# Patient Record
Sex: Male | Born: 1965 | Hispanic: No | Marital: Single | Smoking: Never smoker
Health system: Southern US, Community
[De-identification: ages and names within clinical notes are randomized; demographics above are authoritative.]

## PROBLEM LIST (undated history)

## (undated) DIAGNOSIS — E785 Hyperlipidemia, unspecified: Secondary | ICD-10-CM

## (undated) DIAGNOSIS — S2249XA Multiple fractures of ribs, unspecified side, initial encounter for closed fracture: Secondary | ICD-10-CM

## (undated) DIAGNOSIS — S62109A Fracture of unspecified carpal bone, unspecified wrist, initial encounter for closed fracture: Secondary | ICD-10-CM

## (undated) DIAGNOSIS — J302 Other seasonal allergic rhinitis: Secondary | ICD-10-CM

## (undated) DIAGNOSIS — S42009A Fracture of unspecified part of unspecified clavicle, initial encounter for closed fracture: Secondary | ICD-10-CM

## (undated) DIAGNOSIS — I10 Essential (primary) hypertension: Secondary | ICD-10-CM

## (undated) DIAGNOSIS — T7840XA Allergy, unspecified, initial encounter: Secondary | ICD-10-CM

## (undated) DIAGNOSIS — IMO0002 Reserved for concepts with insufficient information to code with codable children: Secondary | ICD-10-CM

## (undated) DIAGNOSIS — N200 Calculus of kidney: Secondary | ICD-10-CM

## (undated) HISTORY — DX: Fracture of unspecified carpal bone, unspecified wrist, initial encounter for closed fracture: S62.109A

## (undated) HISTORY — PX: POLYPECTOMY: SHX149

## (undated) HISTORY — DX: Calculus of kidney: N20.0

## (undated) HISTORY — DX: Essential (primary) hypertension: I10

## (undated) HISTORY — DX: Fracture of unspecified part of unspecified clavicle, initial encounter for closed fracture: S42.009A

## (undated) HISTORY — DX: Other seasonal allergic rhinitis: J30.2

## (undated) HISTORY — PX: CLOSED REDUCTION ZYGOMATIC ARCH FRACTURE: SHX1362

## (undated) HISTORY — DX: Multiple fractures of ribs, unspecified side, initial encounter for closed fracture: S22.49XA

## (undated) HISTORY — DX: Hyperlipidemia, unspecified: E78.5

## (undated) HISTORY — DX: Allergy, unspecified, initial encounter: T78.40XA

## (undated) HISTORY — PX: OTHER SURGICAL HISTORY: SHX169

## (undated) HISTORY — DX: Reserved for concepts with insufficient information to code with codable children: IMO0002

---

## 2009-08-07 DIAGNOSIS — N200 Calculus of kidney: Secondary | ICD-10-CM

## 2009-08-07 HISTORY — DX: Calculus of kidney: N20.0

## 2010-01-27 ENCOUNTER — Encounter: Admission: RE | Admit: 2010-01-27 | Discharge: 2010-01-27 | Payer: Self-pay | Admitting: Orthopedic Surgery

## 2010-02-18 ENCOUNTER — Ambulatory Visit (HOSPITAL_COMMUNITY): Admission: RE | Admit: 2010-02-18 | Discharge: 2010-02-18 | Payer: Self-pay | Admitting: Orthopedic Surgery

## 2010-07-17 ENCOUNTER — Emergency Department (HOSPITAL_COMMUNITY)
Admission: EM | Admit: 2010-07-17 | Discharge: 2010-07-18 | Payer: Self-pay | Source: Home / Self Care | Admitting: Emergency Medicine

## 2010-08-28 ENCOUNTER — Encounter: Payer: Self-pay | Admitting: Orthopedic Surgery

## 2010-10-18 LAB — CBC
MCH: 31 pg (ref 26.0–34.0)
Platelets: 218 10*3/uL (ref 150–400)
RBC: 4.74 MIL/uL (ref 4.22–5.81)
WBC: 10 10*3/uL (ref 4.0–10.5)

## 2010-10-18 LAB — POCT I-STAT, CHEM 8
Chloride: 106 mEq/L (ref 96–112)
HCT: 47 % (ref 39.0–52.0)
Potassium: 3.5 mEq/L (ref 3.5–5.1)
Sodium: 143 mEq/L (ref 135–145)

## 2010-10-18 LAB — URINE MICROSCOPIC-ADD ON

## 2010-10-18 LAB — COMPREHENSIVE METABOLIC PANEL
AST: 40 U/L — ABNORMAL HIGH (ref 0–37)
Albumin: 4.3 g/dL (ref 3.5–5.2)
Chloride: 106 mEq/L (ref 96–112)
Creatinine, Ser: 1.04 mg/dL (ref 0.4–1.5)
GFR calc Af Amer: >60 mL/min (ref >60)
Potassium: 3.5 mEq/L (ref 3.5–5.1)
Total Bilirubin: 0.7 mg/dL (ref 0.3–1.2)

## 2010-10-18 LAB — URINALYSIS, ROUTINE W REFLEX MICROSCOPIC
Bilirubin Urine: NEGATIVE
Glucose, UA: NEGATIVE mg/dL
Ketones, ur: NEGATIVE mg/dL
Nitrite: NEGATIVE
pH: 7 (ref 5.0–8.0)

## 2010-10-18 LAB — DIFFERENTIAL
Basophils Absolute: 0 10*3/uL (ref 0.0–0.1)
Eosinophils Relative: 1 % (ref 0–5)
Lymphocytes Relative: 41 % (ref 12–46)
Monocytes Absolute: 0.7 10*3/uL (ref 0.1–1.0)

## 2014-08-07 HISTORY — PX: COLONOSCOPY: SHX174

## 2014-10-23 ENCOUNTER — Encounter: Payer: Self-pay | Admitting: Internal Medicine

## 2014-11-25 ENCOUNTER — Encounter: Payer: Self-pay | Admitting: Internal Medicine

## 2014-12-02 ENCOUNTER — Ambulatory Visit (AMBULATORY_SURGERY_CENTER): Payer: Self-pay | Admitting: *Deleted

## 2014-12-02 VITALS — Ht 67.0 in | Wt 200.0 lb

## 2014-12-02 DIAGNOSIS — Z1211 Encounter for screening for malignant neoplasm of colon: Secondary | ICD-10-CM

## 2014-12-02 MED ORDER — NA SULFATE-K SULFATE-MG SULF 17.5-3.13-1.6 GM/177ML PO SOLN: 1.0000 | Freq: Once | ORAL | Status: DC

## 2014-12-02 MED ORDER — MOVIPREP 100 G PO SOLR: 1.0000 | Freq: Once | ORAL | Status: DC

## 2014-12-02 NOTE — Progress Notes (Signed)
Denies allergies to eggs or soy products. Denies complications with sedation or anesthesia. Denies O2 use. Denies use of diet or weight loss medications.  Emmi instructions given for colonoscopy.  

## 2014-12-11 ENCOUNTER — Other Ambulatory Visit: Payer: Self-pay | Admitting: Internal Medicine

## 2014-12-11 ENCOUNTER — Ambulatory Visit (AMBULATORY_SURGERY_CENTER): Payer: BLUE CROSS/BLUE SHIELD | Admitting: Internal Medicine

## 2014-12-11 ENCOUNTER — Encounter: Payer: Self-pay | Admitting: Internal Medicine

## 2014-12-11 VITALS — BP 125/88 | HR 74 | Temp 97.1°F | Resp 14 | Ht 67.0 in | Wt 200.0 lb

## 2014-12-11 DIAGNOSIS — D120 Benign neoplasm of cecum: Secondary | ICD-10-CM | POA: Diagnosis not present

## 2014-12-11 DIAGNOSIS — Z1211 Encounter for screening for malignant neoplasm of colon: Secondary | ICD-10-CM | POA: Diagnosis present

## 2014-12-11 MED ORDER — SODIUM CHLORIDE 0.9 % IV SOLN: 500.0000 mL | INTRAVENOUS | Status: DC

## 2014-12-11 NOTE — Progress Notes (Signed)
Called to room to assist during endoscopic procedure.  Patient ID and intended procedure confirmed with present staff. Received instructions for my participation in the procedure from the performing physician.  

## 2014-12-11 NOTE — Op Note (Signed)
[place]  [address] [place], [id number]   COLONOSCOPY PROCEDURE REPORT  PATIENT: Samuel, Stewart  MR#: [id number] BIRTHDATE: [birth year] , 48  yrs. old GENDER: male ENDOSCOPIST: Jerene Bears, MD REFERRED FO:YDXA Perini, M.D. PROCEDURE DATE:  12/11/2014 PROCEDURE:   Colonoscopy, screening and Colonoscopy with snare polypectomy First Screening Colonoscopy - Avg.  risk and is 50 yrs.  old or older Yes.  Prior Negative Screening - Now for repeat screening. N/A  History of Adenoma - Now for follow-up colonoscopy & has been > or = to 3 yrs.  N/A  Polyps Removed Today ASA CLASS:   Class II INDICATIONS:Screening for colonic neoplasia and Colorectal Neoplasm Risk Assessment for this procedure is average risk. MEDICATIONS: Monitored anesthesia care and Propofol 200 mg IV  DESCRIPTION OF PROCEDURE:   After the risks benefits and alternatives of the procedure were thoroughly explained, informed consent was obtained.  The digital rectal exam revealed no rectal mass.   The LB CF-[id number] Loaner [id number]  endoscope was introduced through the anus and advanced to the cecum, which was identified by both the appendix and ileocecal valve. No adverse events experienced.   The quality of the prep was good.  (MoviPrep was used)  The instrument was then slowly withdrawn as the colon was fully examined.  COLON FINDINGS: A sessile polyp measuring 5 mm in size was found at the cecum.  A polypectomy was performed with a cold snare.  The resection was complete, the polyp tissue was completely retrieved and sent to histology.   There was moderate diverticulosis noted in the descending colon and sigmoid colon.  Retroflexed views revealed internal hemorrhoids. The time to cecum = 1.5 Withdrawal time = 10.5   The scope was withdrawn and the procedure completed. COMPLICATIONS: There were no immediate complications.  ENDOSCOPIC IMPRESSION: 1.   Sessile polyp was found at the cecum;  polypectomy was performed with a cold snare 2.   Moderate diverticulosis was noted in the descending colon and sigmoid colon  RECOMMENDATIONS: 1.  Await pathology results 2.  High fiber diet 3.  If the polyp removed today is proven to be an adenomatous (pre-cancerous) polyp, you will need a repeat colonoscopy in 5 years.  Otherwise you should continue to follow colorectal cancer screening guidelines for "routine risk" patients with colonoscopy in 10 years.  You will receive a letter within 1-2 weeks with the results of your biopsy as well as final recommendations.  Please call my office if you have not received a letter after 3 weeks.  eSigned:  Jerene Bears, MD 12/11/2014 12:24 PM   cc: Crist Infante, MD and The Patient

## 2014-12-11 NOTE — Patient Instructions (Signed)
Discharge instructions given. Handouts on polyps,diverticulosis and hemorrhoids. Resume previous medications. YOU HAD AN ENDOSCOPIC PROCEDURE TODAY AT THE  ENDOSCOPY CENTER:   Refer to the procedure report that was given to you for any specific questions about what was found during the examination.  If the procedure report does not answer your questions, please call your gastroenterologist to clarify.  If you requested that your care partner not be given the details of your procedure findings, then the procedure report has been included in a sealed envelope for you to review at your convenience later.  YOU SHOULD EXPECT: Some feelings of bloating in the abdomen. Passage of more gas than usual.  Walking can help get rid of the air that was put into your GI tract during the procedure and reduce the bloating. If you had a lower endoscopy (such as a colonoscopy or flexible sigmoidoscopy) you may notice spotting of blood in your stool or on the toilet paper. If you underwent a bowel prep for your procedure, you may not have a normal bowel movement for a few days.  Please Note:  You might notice some irritation and congestion in your nose or some drainage.  This is from the oxygen used during your procedure.  There is no need for concern and it should clear up in a day or so.  SYMPTOMS TO REPORT IMMEDIATELY:   Following lower endoscopy (colonoscopy or flexible sigmoidoscopy):  Excessive amounts of blood in the stool  Significant tenderness or worsening of abdominal pains  Swelling of the abdomen that is new, acute  Fever of 100F or higher   For urgent or emergent issues, a gastroenterologist can be reached at any hour by calling [phone].   DIET: Your first meal following the procedure should be a small meal and then it is ok to progress to your normal diet. Heavy or fried foods are harder to digest and may make you feel nauseous or bloated.  Likewise, meals heavy in dairy and  vegetables can increase bloating.  Drink plenty of fluids but you should avoid alcoholic beverages for 24 hours.  ACTIVITY:  You should plan to take it easy for the rest of today and you should NOT DRIVE or use heavy machinery until tomorrow (because of the sedation medicines used during the test).    FOLLOW UP: Our staff will call the number listed on your records the next business day following your procedure to check on you and address any questions or concerns that you may have regarding the information given to you following your procedure. If we do not reach you, we will leave a message.  However, if you are feeling well and you are not experiencing any problems, there is no need to return our call.  We will assume that you have returned to your regular daily activities without incident.  If any biopsies were taken you will be contacted by phone or by letter within the next 1-3 weeks.  Please call us at [phone] if you have not heard about the biopsies in 3 weeks.    SIGNATURES/CONFIDENTIALITY: You and/or your care partner have signed paperwork which will be entered into your electronic medical record.  These signatures attest to the fact that that the information above on your After Visit Summary has been reviewed and is understood.  Full responsibility of the confidentiality of this discharge information lies with you and/or your care-partner. 

## 2014-12-11 NOTE — Progress Notes (Signed)
A/ox3 pleased with MAC, report to Celia RN 

## 2014-12-14 ENCOUNTER — Telehealth: Payer: Self-pay | Admitting: *Deleted

## 2014-12-14 NOTE — Telephone Encounter (Signed)
  Follow up Call-  Call back number 12/11/2014  Post procedure Call Back phone  # [phone]  Permission to leave phone message Yes     Patient questions:  Do you have a fever, pain , or abdominal swelling? No. Pain Score  0 *  Have you tolerated food without any problems? Yes.    Have you been able to return to your normal activities? Yes.    Do you have any questions about your discharge instructions: Diet   No. Medications  No. Follow up visit  No.  Do you have questions or concerns about your Care? Yes.    Actions: * If pain score is 4 or above: No action needed, pain <4. Patient stating he was sent two preps for the procedure. This has been turned over to Director of [place], patient to expect a call.

## 2014-12-15 ENCOUNTER — Encounter: Payer: Self-pay | Admitting: Internal Medicine

## 2014-12-18 ENCOUNTER — Encounter: Payer: Self-pay | Admitting: Internal Medicine

## 2014-12-28 ENCOUNTER — Encounter: Payer: Self-pay | Admitting: Internal Medicine

## 2015-07-07 ENCOUNTER — Other Ambulatory Visit: Payer: Self-pay | Admitting: Orthopaedic Surgery

## 2015-07-07 DIAGNOSIS — M25551 Pain in right hip: Secondary | ICD-10-CM

## 2015-07-23 ENCOUNTER — Other Ambulatory Visit: Payer: BLUE CROSS/BLUE SHIELD

## 2015-08-10 ENCOUNTER — Ambulatory Visit
Admission: RE | Admit: 2015-08-10 | Discharge: 2015-08-10 | Disposition: A | Discharge status: Home / Self Care | Payer: BLUE CROSS/BLUE SHIELD | Source: Ambulatory Visit | Attending: Orthopaedic Surgery | Admitting: Orthopaedic Surgery

## 2015-08-10 DIAGNOSIS — M25551 Pain in right hip: Secondary | ICD-10-CM

## 2015-09-10 ENCOUNTER — Encounter (HOSPITAL_COMMUNITY): Payer: BLUE CROSS/BLUE SHIELD

## 2015-09-15 ENCOUNTER — Telehealth: Payer: Self-pay | Admitting: Cardiology

## 2015-09-15 NOTE — Telephone Encounter (Signed)
Received records from [facility] for appointment on 09/23/15 with Dr Percival Spanish.  Records given to [facility] (medical records) for Dr Hochrein's schedule on 09/23/15. lp

## 2015-09-17 ENCOUNTER — Encounter: Payer: Self-pay | Admitting: Cardiovascular Disease

## 2015-09-17 ENCOUNTER — Ambulatory Visit (INDEPENDENT_AMBULATORY_CARE_PROVIDER_SITE_OTHER): Payer: BLUE CROSS/BLUE SHIELD | Admitting: Cardiovascular Disease

## 2015-09-17 VITALS — BP 140/92 | HR 80 | Ht 67.0 in | Wt 198.0 lb

## 2015-09-17 DIAGNOSIS — Z79899 Other long term (current) drug therapy: Secondary | ICD-10-CM

## 2015-09-17 DIAGNOSIS — I10 Essential (primary) hypertension: Secondary | ICD-10-CM

## 2015-09-17 HISTORY — DX: Essential (primary) hypertension: I10

## 2015-09-17 MED ORDER — LOSARTAN POTASSIUM 25 MG PO TABS: 25.0000 mg | ORAL_TABLET | Freq: Every day | ORAL | Status: DC

## 2015-09-17 NOTE — Progress Notes (Signed)
Cardiology Office Note   Date:  09/17/2015   ID:  Samuel Stewart, DOB 19-Mar-1966, MRN [id number]  PCP:  Samuel Ly, MD  Cardiologist:   Samuel Harness, MD   Chief Complaint  Patient presents with  . Hypertension    no chest pain, no shortness of breath, no swelling, no cramping, no dizziness or lightheadedness      History of Present Illness: Samuel Stewart is a 50 y.o. male with hypertension and hyperlipidemia who presents for an evaluation of hypertension.  Samuel Stewart reports that his blood pressure has been intermittently elevated since at least 2013.  He has never been on any medication for this. His mother is a pediatrician in his father is a Financial trader.  They live in [place], however, whenever she visits she checks his blood pressure. She brings a record that shows that it has been as high as 160/104. He saw Dr. Joylene Draft on 09/10/15 and his blood pressure was elevated to 155/98.   Dr. Joylene Draft recommended that he start valsartan.  However, Samuel Stewart requested seeing a cardiologist before starting this medication.  Samuel Stewart does not get regular exercise.  He denies any chest pain, shortness of breath, palpitations, lightheadedness, dizziness, vision changes, lower extremity edema or orthopnea His only complaint is occasional pain in his hips that is attributable to transient osteoporosis and is improving after taking vitamin D.   Past Medical History  Diagnosis Date  . Seasonal allergies   . Kidney stones 2011  . Wrist fracture     bilateral  . Multiple rib fractures   . Compression fracture     2 thoracic, 1 lumbar  . Collar bone fracture   . Hyperlipidemia   . Essential hypertension 09/17/2015    Past Surgical History  Procedure Laterality Date  . Closed reduction zygomatic arch fracture    . Olecranon process fracture    . Osteochondritis desicant       Current Outpatient Prescriptions  Medication Sig Dispense Refill  . atorvastatin (LIPITOR)  80 MG tablet Take 80 mg by mouth daily.    Marland Kitchen ezetimibe (ZETIA) 10 MG tablet Take 20 mg by mouth daily.    Marland Kitchen losartan (COZAAR) 25 MG tablet Take 1 tablet (25 mg total) by mouth daily. 30 tablet 6   No current facility-administered medications for this visit.    Allergies:   Ibuprofen and Iodine    Social History:  The patient  reports that he has never smoked. He has never used smokeless tobacco. He reports that he does not use illicit drugs.   Family History:  The patient's family history includes Bladder Cancer in his maternal grandfather; Hypertension in his father and mother; Kidney disease in his maternal grandmother. There is no history of Colon cancer.    ROS:  Please see the history of present illness.   Otherwise, review of systems are positive for none.   All other systems are reviewed and negative.    PHYSICAL EXAM: VS:  BP 140/92 mmHg  Pulse 80  Ht 5\' 7"  (1.702 m)  Wt 89.812 kg (198 lb)  BMI 31.00 kg/m2 , BMI Body mass index is 31 kg/(m^2). GENERAL:  Well appearing HEENT:  Pupils equal round and reactive, fundi not visualized, oral mucosa unremarkable NECK:  No jugular venous distention, waveform within normal limits, carotid upstroke brisk and symmetric, no bruits, no thyromegaly LYMPHATICS:  No cervical adenopathy LUNGS:  Clear to auscultation bilaterally HEART:  RRR.  PMI not displaced or  sustained,S1 and S2 within normal limits, no S3, no S4, no clicks, no rubs, no murmurs ABD:  Flat, positive bowel sounds normal in frequency in pitch, no bruits, no rebound, no guarding, no midline pulsatile mass, no hepatomegaly, no splenomegaly EXT:  2 plus pulses throughout, no edema, no cyanosis no clubbing SKIN:  No rashes no nodules NEURO:  Cranial nerves II through XII grossly intact, motor grossly intact throughout PSYCH:  Cognitively intact, oriented to person place and time    EKG:  EKG is ordered today. The ekg ordered today demonstrates sinus rhythm.  Rate 80 bpm.     Recent Labs: No results found for requested labs within last 365 days.    09/02/15: Magnesium 2.4 Phosphorus 3.1 Sodium 140, potassium 4.2, BUN 16, creatinine 1 WBC 8,hemoglobin 14.8, hematocrit 42.6, platelets 246  Lipid Panel No results found for: CHOL, TRIG, HDL, CHOLHDL, VLDL, LDLCALC, LDLDIRECT    Wt Readings from Last 3 Encounters:  09/17/15 89.812 kg (198 lb)  08/10/15 88.451 kg (195 lb)  12/11/14 90.719 kg (200 lb)      ASSESSMENT AND PLAN:  # Hypertension: Mr. [name] blood pressure is elevated slightly today.They started on the readings in his ambulatory log as well as his blood pressure when he saw Dr. Joylene Draft, I agree that he should startan antihypertensive at this time. We will start losartan 25 mg daily with plans to repeat his basic metabolic panel in one week. He does not have any evidence of heart failure or LVH on his EKG. Therefore, I do not think he needs an echocardiogram or any additional testing at this time. I instructed him that if he were to start exercising and lose weight, he may not need any antihypertensives at all right now.   Current medicines are reviewed at length with the patient today.  The patient does not have concerns regarding medicines.  The following changes have been made:  Start losartan 25 mg daily  Labs/ tests ordered today include:   Orders Placed This Encounter  Procedures  . Basic metabolic panel  . EKG 12-Lead     Disposition:   FU with Samuel Stewart C. Oval Linsey, MD, [facility] in 1 month    This note was written with the assistance of speech recognition software.  Please excuse any transcriptional errors.  Signed, Samuel Stewart C. Oval Linsey, MD, [facility]  09/17/2015 2:01 PM    [facility]

## 2015-09-17 NOTE — Patient Instructions (Signed)
PLEASE HAVE LABS-BMP-- IN 7 DAYS AFTER STARTING LOSARTAN 25 MG. WILL CALL WITH RESULTS   START LOSARTAN 25 MG ONE TABLET DAILY.   Your physician wants you to follow-up in [place]. If you need a refill on your cardiac medications before your next appointment, please call your pharmacy.

## 2015-09-20 ENCOUNTER — Encounter: Payer: Self-pay | Admitting: *Deleted

## 2015-09-23 ENCOUNTER — Ambulatory Visit: Payer: BLUE CROSS/BLUE SHIELD | Admitting: Cardiology

## 2015-09-24 ENCOUNTER — Other Ambulatory Visit: Payer: Self-pay | Admitting: *Deleted

## 2015-09-24 DIAGNOSIS — M898X9 Other specified disorders of bone, unspecified site: Secondary | ICD-10-CM

## 2015-09-24 DIAGNOSIS — M8580 Other specified disorders of bone density and structure, unspecified site: Secondary | ICD-10-CM

## 2015-09-24 DIAGNOSIS — M810 Age-related osteoporosis without current pathological fracture: Secondary | ICD-10-CM

## 2015-09-25 LAB — BASIC METABOLIC PANEL
BUN: 12 mg/dL (ref 7–25)
CHLORIDE: 105 mmol/L (ref 98–110)
CO2: 27 mmol/L (ref 20–31)
Calcium: 9.5 mg/dL (ref 8.6–10.3)
Creat: 0.9 mg/dL (ref 0.70–1.33)
GLUCOSE: 94 mg/dL (ref 65–99)
POTASSIUM: 4.2 mmol/L (ref 3.5–5.3)
Sodium: 140 mmol/L (ref 135–146)

## 2015-10-05 ENCOUNTER — Telehealth: Payer: Self-pay | Admitting: Physician Assistant

## 2015-10-05 ENCOUNTER — Telehealth: Payer: Self-pay | Admitting: Cardiovascular Disease

## 2015-10-05 NOTE — Telephone Encounter (Signed)
Paged by answering service. Patient has a question regarding his blood pressure medicine  losartan.Dr. Blenda Mounts daw his vitamin D is overall doing this blood work which is managed by his PCP. Advised patient to call his PCP office if he can draw both blood work at same time. Seems ordered already been placed.  Advised patient to keep log his blood pressure range. F/u with Dr. Oval Linsey if blood presser is still elevated.  Erric Machnik, [place]

## 2015-10-05 NOTE — Telephone Encounter (Signed)
Samuel Stewart reports he has been very dissatisfied with his service and care. Was not given results of a Vitamin D level from [place] (and after investigation, appears it was not drawn). He is concerned because he is trying to follow an issue of transient osteoporosis. I apologized to patient and stated anything we could do to reasonably address his concerns, we would try to do. Pt does admit some of this is out of our hands, for example, he notes [facility] lab charged him a $30 hold on his card for labwork.  Asks if in retrospect reasonable to request PCP to manage his HTN issues because he has not gotten feedback about what to do regarding his losartan.  Notes he is currently taking 25mg  daily, initially BPs were averaging 0000000 systolic, now [id number] systolic. He is looking for further improvement.  He would like to see if Dr. Oval Linsey suggests increasing or changing med. Will route for advice.

## 2015-10-05 NOTE — Telephone Encounter (Signed)
Calling because he has been taken this medication (Losartan 25mg ) and his blood pressure has not change , Wants to know how long does the medication takes to affect . Trying to understand and is asking if someone can call him .   Thanks

## 2015-10-05 NOTE — Telephone Encounter (Signed)
LMTCB  There is an orders only encounter where a vitamin D 1-25 dihdroxy was ordered on 2/18 - no results in EPIC so unsure if this lab was processed

## 2015-10-05 NOTE — Telephone Encounter (Signed)
I'm not sure that I understand.  I did not order a Vitamin D level, as I do not manage osteoporosis.  I recommended that he continue his losartan based on the BMP that was drawn on 2/17.  This message was relayed on 2/18.  Based on these blood pressures I would recommend increasing his losartan to 50 mg and repeating the BMP in one week.  I'm happy to see him in clinic in 1-2 weeks to make additional adjustments to his medication as necessary.  If his blood pressure has decreased from 150-160 to 140-150 it is working, he likely just needs a stronger dose.

## 2015-10-13 ENCOUNTER — Telehealth: Payer: Self-pay | Admitting: Cardiovascular Disease

## 2015-10-13 DIAGNOSIS — Z79899 Other long term (current) drug therapy: Secondary | ICD-10-CM

## 2015-10-13 NOTE — Telephone Encounter (Signed)
PATIENT NEEDED ORDER PLACED  BMP -- ORDER GIVEN LAST WEEK  INCREASED LOSARTAN TO 50 MG DAILY [facility] REP IS AWARE.   PATIENT AWARE - VITAMIN D LEVEL RESULTS WILL BE SENT TO DR PERINI TO EVALUATE

## 2015-10-14 LAB — BASIC METABOLIC PANEL
BUN: 15 mg/dL (ref 7–25)
CALCIUM: 9.3 mg/dL (ref 8.6–10.3)
CHLORIDE: 103 mmol/L (ref 98–110)
CO2: 25 mmol/L (ref 20–31)
CREATININE: 0.91 mg/dL (ref 0.70–1.33)
Glucose, Bld: 97 mg/dL (ref 65–99)
Potassium: 4.8 mmol/L (ref 3.5–5.3)
Sodium: 142 mmol/L (ref 135–146)

## 2015-10-14 NOTE — Telephone Encounter (Signed)
lmtc 3/9

## 2015-10-15 ENCOUNTER — Encounter: Payer: Self-pay | Admitting: Cardiovascular Disease

## 2015-10-15 ENCOUNTER — Ambulatory Visit (INDEPENDENT_AMBULATORY_CARE_PROVIDER_SITE_OTHER): Payer: BLUE CROSS/BLUE SHIELD | Admitting: Cardiovascular Disease

## 2015-10-15 VITALS — BP 144/90 | HR 100 | Ht 67.0 in | Wt 196.2 lb

## 2015-10-15 DIAGNOSIS — E785 Hyperlipidemia, unspecified: Secondary | ICD-10-CM

## 2015-10-15 DIAGNOSIS — I10 Essential (primary) hypertension: Secondary | ICD-10-CM | POA: Diagnosis not present

## 2015-10-15 NOTE — Patient Instructions (Addendum)
Medication Instructions:  STOP LOSARTAN    Labwork: NONE  Testing/Procedures: NONE  Follow-Up: AS NEEDED   If you need a refill on your cardiac medications before your next appointment, please call your pharmacy.

## 2015-10-15 NOTE — Progress Notes (Signed)
Cardiology Office Note   Date:  10/15/2015   ID:  Samuel Stewart, DOB 09-11-1965, MRN [id number]  PCP:  Samuel Ly, MD  Cardiologist:   Samuel Harness, MD   Chief Complaint  Patient presents with  . Follow-up    pt states no chest pain no SOB no edema no light headedness or dizziness      Patient ID: Samuel Stewart is a 50 y.o. male with hypertension and hyperlipidemia who presents for an evaluation of hypertension.    Interval History 10/15/15: After his last appointment Samuel Stewart started on losartan.  His BP remained poorly-controlled so the dose was increased to 100 mg.  BMP has been stable.  Since starting losartan he has not noted any improvement in his blood pressure.  He reports taking it daily.  He has not noted any chest pain or shortness of breath.  Samuel Stewart has not been exercising regularly.  He previously had hip pain from transient osteoporosis.  This has now resolved and he wonders if exercise will resolve his hypertension.   History of Present Illness 09/17/15: Samuel Stewart reports that his blood pressure has been intermittently elevated since at least 2013.  He has never been on any medication for this. His mother is a pediatrician in his father is a Financial trader.  They live in [place], however, whenever she visits she checks his blood pressure. She brings a record that shows that it has been as high as 160/104. He saw Dr. Joylene Stewart on 09/10/15 and his blood pressure was elevated to 155/98.   Dr. Joylene Stewart recommended that he start valsartan.  However, Samuel Stewart requested seeing a cardiologist before starting this medication.  Samuel Stewart does not get regular exercise.  He denies any chest pain, shortness of breath, palpitations, lightheadedness, dizziness, vision changes, lower extremity edema or orthopnea His only complaint is occasional pain in his hips that is attributable to transient osteoporosis and is improving after taking vitamin D.   Past Medical  History  Diagnosis Date  . Seasonal allergies   . Kidney stones 2011  . Wrist fracture     bilateral  . Multiple rib fractures   . Compression fracture     2 thoracic, 1 lumbar  . Collar bone fracture   . Hyperlipidemia   . Essential hypertension 09/17/2015    Past Surgical History  Procedure Laterality Date  . Closed reduction zygomatic arch fracture    . Olecranon process fracture    . Osteochondritis desicant       Current Outpatient Prescriptions  Medication Sig Dispense Refill  . amoxicillin (AMOXIL) 500 MG capsule Take 500 mg by mouth 3 (three) times daily.  0  . atorvastatin (LIPITOR) 80 MG tablet Take 80 mg by mouth daily.    Marland Kitchen ezetimibe (ZETIA) 10 MG tablet Take 20 mg by mouth daily.    Marland Kitchen oseltamivir (TAMIFLU) 75 MG capsule Take 1 capsule by mouth 2 (two) times daily.  0   No current facility-administered medications for this visit.    Allergies:   Ibuprofen and Iodine    Social History:  The patient  reports that he has never smoked. He has never used smokeless tobacco. He reports that he does not use illicit drugs.   Family History:  The patient's family history includes Bladder Cancer in his maternal grandfather; Hypertension in his father and mother; Kidney disease in his maternal grandmother. There is no history of Colon cancer.    ROS:  Please see  the history of present illness.   Otherwise, review of systems are positive for none.   All other systems are reviewed and negative.    PHYSICAL EXAM: VS:  BP 144/90 mmHg  Pulse 100  Ht 5\' 7"  (1.702 m)  Wt 88.996 kg (196 lb 3.2 oz)  BMI 30.72 kg/m2 , BMI Body mass index is 30.72 kg/(m^2). GENERAL:  Well appearing HEENT:  Pupils equal round and reactive, fundi not visualized, oral mucosa unremarkable NECK:  No jugular venous distention, waveform within normal limits LUNGS:  Clear to auscultation bilaterally HEART:  RRR.  PMI not displaced or sustained,S1 and S2 within normal limits, no S3, no S4, no clicks,  no rubs, no murmurs ABD:  Flat, positive bowel sounds normal in frequency in pitch, no bruits, no rebound, no guarding, no midline pulsatile mass, no hepatomegaly, no splenomegaly EXT:  2 plus pulses throughout, no edema, no cyanosis no clubbing SKIN:  No rashes no nodules NEURO:  Cranial nerves II through XII grossly intact, motor grossly intact throughout PSYCH:  Cognitively intact, oriented to person place and time  EKG:  EKG is not ordered today.  Recent Labs: 10/13/2015: BUN 15; Creat 0.91; Potassium 4.8; Sodium 142   09/02/15: Magnesium 2.4 Phosphorus 3.1 Sodium 140, potassium 4.2, BUN 16, creatinine 1 WBC 8,hemoglobin 14.8, hematocrit 42.6, platelets 246  Lipid Panel No results found for: CHOL, TRIG, HDL, CHOLHDL, VLDL, LDLCALC, LDLDIRECT    Wt Readings from Last 3 Encounters:  10/15/15 88.996 kg (196 lb 3.2 oz)  09/17/15 89.812 kg (198 lb)  08/10/15 88.451 kg (195 lb)      ASSESSMENT AND PLAN:  # Hypertension: Samuel Stewart blood pressure remains elevated on losartan 50 mg daily.  He would like to try lifestyle intervention before starting any other medications.  This is reasonable.  He has a follow up appointment scheduled with his PCP in 1 month.  He will follow up with his PCP at that time and will return here as needed.  # Hyperlipidemia:  Continue atorvastatin and Zetia.  Current medicines are reviewed at length with the patient today.  The patient does not have concerns regarding medicines.  The following changes have been made:  Stop losartan  Labs/ tests ordered today include:   No orders of the defined types were placed in this encounter.    Disposition:   FU with Samuel Suits C. Oval Linsey, MD, [facility] as needed.   This note was written with the assistance of speech recognition software.  Please excuse any transcriptional errors.  Signed, Samuel Vath C. Oval Linsey, MD, [facility]  10/15/2015 4:12 PM    [facility]

## 2015-10-17 LAB — VITAMIN D 1,25 DIHYDROXY
VITAMIN D3 1, 25 (OH): 98 pg/mL
Vitamin D 1, 25 (OH)2 Total: 98 pg/mL — ABNORMAL HIGH (ref 18–72)
Vitamin D2 1, 25 (OH)2: <8 pg/mL

## 2015-11-17 DIAGNOSIS — M810 Age-related osteoporosis without current pathological fracture: Secondary | ICD-10-CM | POA: Diagnosis not present

## 2015-11-17 DIAGNOSIS — Z125 Encounter for screening for malignant neoplasm of prostate: Secondary | ICD-10-CM | POA: Diagnosis not present

## 2015-11-17 DIAGNOSIS — Z0000 Encounter for general adult medical examination without abnormal findings: Secondary | ICD-10-CM | POA: Diagnosis not present

## 2015-11-22 DIAGNOSIS — D7589 Other specified diseases of blood and blood-forming organs: Secondary | ICD-10-CM | POA: Diagnosis not present

## 2015-11-25 DIAGNOSIS — Z0000 Encounter for general adult medical examination without abnormal findings: Secondary | ICD-10-CM | POA: Diagnosis not present

## 2015-11-25 DIAGNOSIS — M810 Age-related osteoporosis without current pathological fracture: Secondary | ICD-10-CM | POA: Diagnosis not present

## 2015-11-25 DIAGNOSIS — D7589 Other specified diseases of blood and blood-forming organs: Secondary | ICD-10-CM | POA: Diagnosis not present

## 2015-11-25 DIAGNOSIS — Z1389 Encounter for screening for other disorder: Secondary | ICD-10-CM | POA: Diagnosis not present

## 2015-11-25 DIAGNOSIS — M546 Pain in thoracic spine: Secondary | ICD-10-CM | POA: Diagnosis not present

## 2015-11-25 DIAGNOSIS — M25551 Pain in right hip: Secondary | ICD-10-CM | POA: Diagnosis not present

## 2015-11-25 DIAGNOSIS — Z6830 Body mass index (BMI) 30.0-30.9, adult: Secondary | ICD-10-CM | POA: Diagnosis not present

## 2015-11-30 DIAGNOSIS — Z1212 Encounter for screening for malignant neoplasm of rectum: Secondary | ICD-10-CM | POA: Diagnosis not present

## 2016-09-06 DIAGNOSIS — J019 Acute sinusitis, unspecified: Secondary | ICD-10-CM | POA: Diagnosis not present

## 2016-09-06 DIAGNOSIS — R05 Cough: Secondary | ICD-10-CM | POA: Diagnosis not present

## 2016-09-06 DIAGNOSIS — E559 Vitamin D deficiency, unspecified: Secondary | ICD-10-CM | POA: Diagnosis not present

## 2016-09-06 DIAGNOSIS — I10 Essential (primary) hypertension: Secondary | ICD-10-CM | POA: Diagnosis not present

## 2016-10-02 DIAGNOSIS — R05 Cough: Secondary | ICD-10-CM | POA: Diagnosis not present

## 2016-10-02 DIAGNOSIS — J019 Acute sinusitis, unspecified: Secondary | ICD-10-CM | POA: Diagnosis not present

## 2016-10-02 DIAGNOSIS — R509 Fever, unspecified: Secondary | ICD-10-CM | POA: Diagnosis not present

## 2016-10-02 DIAGNOSIS — J040 Acute laryngitis: Secondary | ICD-10-CM | POA: Diagnosis not present

## 2016-12-05 DIAGNOSIS — R8299 Other abnormal findings in urine: Secondary | ICD-10-CM | POA: Diagnosis not present

## 2016-12-05 DIAGNOSIS — Z0000 Encounter for general adult medical examination without abnormal findings: Secondary | ICD-10-CM | POA: Diagnosis not present

## 2016-12-05 DIAGNOSIS — I10 Essential (primary) hypertension: Secondary | ICD-10-CM | POA: Diagnosis not present

## 2016-12-05 DIAGNOSIS — E559 Vitamin D deficiency, unspecified: Secondary | ICD-10-CM | POA: Diagnosis not present

## 2016-12-05 DIAGNOSIS — Z125 Encounter for screening for malignant neoplasm of prostate: Secondary | ICD-10-CM | POA: Diagnosis not present

## 2016-12-11 DIAGNOSIS — R7301 Impaired fasting glucose: Secondary | ICD-10-CM | POA: Diagnosis not present

## 2016-12-11 DIAGNOSIS — E559 Vitamin D deficiency, unspecified: Secondary | ICD-10-CM | POA: Diagnosis not present

## 2016-12-11 DIAGNOSIS — I10 Essential (primary) hypertension: Secondary | ICD-10-CM | POA: Diagnosis not present

## 2016-12-11 DIAGNOSIS — M810 Age-related osteoporosis without current pathological fracture: Secondary | ICD-10-CM | POA: Diagnosis not present

## 2016-12-11 DIAGNOSIS — Z0000 Encounter for general adult medical examination without abnormal findings: Secondary | ICD-10-CM | POA: Diagnosis not present

## 2016-12-11 DIAGNOSIS — R3121 Asymptomatic microscopic hematuria: Secondary | ICD-10-CM | POA: Diagnosis not present

## 2016-12-11 DIAGNOSIS — Z6831 Body mass index (BMI) 31.0-31.9, adult: Secondary | ICD-10-CM | POA: Diagnosis not present

## 2016-12-11 DIAGNOSIS — Z1389 Encounter for screening for other disorder: Secondary | ICD-10-CM | POA: Diagnosis not present

## 2017-03-01 ENCOUNTER — Ambulatory Visit (INDEPENDENT_AMBULATORY_CARE_PROVIDER_SITE_OTHER): Payer: Self-pay | Admitting: Orthopaedic Surgery

## 2017-03-06 ENCOUNTER — Ambulatory Visit (INDEPENDENT_AMBULATORY_CARE_PROVIDER_SITE_OTHER): Payer: BLUE CROSS/BLUE SHIELD | Admitting: Orthopaedic Surgery

## 2017-03-06 ENCOUNTER — Telehealth (INDEPENDENT_AMBULATORY_CARE_PROVIDER_SITE_OTHER): Payer: Self-pay | Admitting: Orthopaedic Surgery

## 2017-03-06 ENCOUNTER — Other Ambulatory Visit (INDEPENDENT_AMBULATORY_CARE_PROVIDER_SITE_OTHER): Payer: Self-pay

## 2017-03-06 ENCOUNTER — Ambulatory Visit (INDEPENDENT_AMBULATORY_CARE_PROVIDER_SITE_OTHER): Payer: Self-pay

## 2017-03-06 DIAGNOSIS — E673 Hypervitaminosis D: Secondary | ICD-10-CM | POA: Diagnosis not present

## 2017-03-06 DIAGNOSIS — M25551 Pain in right hip: Secondary | ICD-10-CM

## 2017-03-06 NOTE — Telephone Encounter (Signed)
Noted  

## 2017-03-06 NOTE — Progress Notes (Signed)
Office Visit Note   Patient: Samuel Stewart           Date of Birth: September 02, 1965           MRN: [id number] Visit Date: 03/06/2017              Requested by: Crist Infante, MD [address] [place],  [id number] PCP: Crist Infante, MD   Assessment & Plan: Visit Diagnoses:  1. Pain in right hip   2. High vitamin D level     Plan: Given his history of transient osteoporosis of both hips as well as the previous MRI findings of the right side it is definitely medically necessary that we obtain an MRI of his right hip to evaluate the cartilage and for recurrence of any transient osteoporosis or even resolution of this. We will also check a vitamin D level today all questions were encouraged and answered. We'll see him back once this MRIs been obtained of the right hip.  Follow-Up Instructions: No Follow-up on file.   Orders:  Orders Placed This Encounter  Procedures  . XR HIP UNILAT W OR W/O PELVIS 1V RIGHT  . Vitamin D (25 hydroxy)   No orders of the defined types were placed in this encounter.     Procedures: No procedures performed   Clinical Data: No additional findings.   Subjective: No chief complaint on file. The patient comes in with a chief complaint of right hip pain is interesting past medical history and the fact that he's had transient osteoporosis of both his hips. These were at different times. The right hip is been painful again for him. Left hip is been pain-free and it eventually completely resolved on follow-up MRI. MRI of his left hip in January 2017 showed transient osteoporosis with slight subcortical irregularity. He's been having pain in the right hip recently with weightbearing. He is also had a low vitamin D level and the past and has been followed regularly by his primary care physician. He like him in D level checked again today. That is correlated with pain improvement when these had a therapeutic vitamin D level and the system. He reports mainly pain  with weightbearing and activities of his right hip. His left hip is pain-free.  HPI  Review of Systems He currently denies any headache, chest pain, short of breath, fever, chills, nausea, vomiting  Objective: Vital Signs: There were no vitals taken for this visit.  Physical Exam He is alert and oriented 3 in no acute distress Ortho Exam Examination of his left hip is normal. Examination of his right hip shows pain on extremes of internal/external rotation and with hip compression. Specialty Comments:  No specialty comments available.  Imaging: Xr Hip Unilat W Or W/o Pelvis 1v Right  Result Date: 03/06/2017 An AP pelvis and lateral of his right hip still show well maintained hip joint. There is slight subcortical irregularity that is difficult to see. There is apparent trigger osteophytes but it is outside the hip capsule    PMFS History: Patient Active Problem List   Diagnosis Date Noted  . Essential hypertension 09/17/2015   Past Medical History:  Diagnosis Date  . Collar bone fracture   . Compression fracture    2 thoracic, 1 lumbar  . Essential hypertension 09/17/2015  . Hyperlipidemia   . Kidney stones 2011  . Multiple rib fractures   . Seasonal allergies   . Wrist fracture    bilateral    Family History  Problem  Relation Age of Onset  . Colon cancer Neg Hx   . Hypertension Mother   . Hypertension Father   . Kidney disease Maternal Grandmother   . Bladder Cancer Maternal Grandfather     Past Surgical History:  Procedure Laterality Date  . CLOSED REDUCTION ZYGOMATIC ARCH FRACTURE    . olecranon process fracture    . osteochondritis desicant     Social History   Occupational History  . Not on file.   Social History Main Topics  . Smoking status: Never Smoker  . Smokeless tobacco: Never Used  . Alcohol use Not on file     Comment: rare  . Drug use: No  . Sexual activity: Not on file

## 2017-03-06 NOTE — Telephone Encounter (Signed)
Pt states he would like MRI to be close to here in this immediate area of [place].

## 2017-03-07 LAB — VITAMIN D 25 HYDROXY (VIT D DEFICIENCY, FRACTURES): Vit D, 25-Hydroxy: 55 ng/mL (ref 30–100)

## 2017-03-09 NOTE — Telephone Encounter (Signed)
Noted, pt will be schedued at [facility]

## 2017-03-15 ENCOUNTER — Ambulatory Visit
Admission: RE | Admit: 2017-03-15 | Discharge: 2017-03-15 | Disposition: A | Discharge status: Home / Self Care | Payer: BLUE CROSS/BLUE SHIELD | Source: Ambulatory Visit | Attending: Orthopaedic Surgery | Admitting: Orthopaedic Surgery

## 2017-03-15 DIAGNOSIS — M25551 Pain in right hip: Secondary | ICD-10-CM | POA: Diagnosis not present

## 2017-03-26 ENCOUNTER — Ambulatory Visit (INDEPENDENT_AMBULATORY_CARE_PROVIDER_SITE_OTHER): Payer: BLUE CROSS/BLUE SHIELD | Admitting: Orthopaedic Surgery

## 2017-04-11 ENCOUNTER — Ambulatory Visit (INDEPENDENT_AMBULATORY_CARE_PROVIDER_SITE_OTHER): Payer: BLUE CROSS/BLUE SHIELD | Admitting: Orthopaedic Surgery

## 2017-04-11 DIAGNOSIS — M25551 Pain in right hip: Secondary | ICD-10-CM | POA: Insufficient documentation

## 2017-04-11 NOTE — Progress Notes (Signed)
The patient is here for follow-up after MRI of his right hip. We need to obtain this MRI due to the severity of his transient osteoporosis that effect of this hip a year ago. He's been having thigh pain proximally as well as spasms in the hip flexor muscles. He's not had a lot of groin pain.  On exam I can easily put [place] at the range of motion with some pain on extremes of motion and some pain over the hip flexors on the right side. MRI is reviewed with him and we went over the actual study. He is a small nidus of avascular necrosis but no cortical collapse and only minimal cartilage thinning in the hip. This area is more central and not over the superior lateral aspect reports most weight. He said his pain is more of an inconvenience and not severe.  At this point I do feel that targeted physical therapy on his thigh muscles in hip flexors could help to the spasms that he has. I gave him a prescription to consider outpatient physical therapy. All questions were encouraged and answered. He'll follow-up as needed.

## 2017-08-20 DIAGNOSIS — M79645 Pain in left finger(s): Secondary | ICD-10-CM | POA: Diagnosis not present

## 2017-08-20 DIAGNOSIS — M65332 Trigger finger, left middle finger: Secondary | ICD-10-CM | POA: Diagnosis not present

## 2017-09-04 DIAGNOSIS — A630 Anogenital (venereal) warts: Secondary | ICD-10-CM | POA: Diagnosis not present

## 2017-09-04 DIAGNOSIS — E119 Type 2 diabetes mellitus without complications: Secondary | ICD-10-CM | POA: Diagnosis not present

## 2017-09-04 DIAGNOSIS — E7849 Other hyperlipidemia: Secondary | ICD-10-CM | POA: Diagnosis not present

## 2017-09-04 DIAGNOSIS — I10 Essential (primary) hypertension: Secondary | ICD-10-CM | POA: Diagnosis not present

## 2017-09-04 DIAGNOSIS — M810 Age-related osteoporosis without current pathological fracture: Secondary | ICD-10-CM | POA: Diagnosis not present

## 2017-09-19 DIAGNOSIS — M65332 Trigger finger, left middle finger: Secondary | ICD-10-CM | POA: Diagnosis not present

## 2017-09-27 DIAGNOSIS — A630 Anogenital (venereal) warts: Secondary | ICD-10-CM | POA: Diagnosis not present

## 2018-01-28 DIAGNOSIS — Z125 Encounter for screening for malignant neoplasm of prostate: Secondary | ICD-10-CM | POA: Diagnosis not present

## 2018-01-28 DIAGNOSIS — Z0000 Encounter for general adult medical examination without abnormal findings: Secondary | ICD-10-CM | POA: Diagnosis not present

## 2018-01-28 DIAGNOSIS — E559 Vitamin D deficiency, unspecified: Secondary | ICD-10-CM | POA: Diagnosis not present

## 2018-01-28 DIAGNOSIS — I10 Essential (primary) hypertension: Secondary | ICD-10-CM | POA: Diagnosis not present

## 2018-01-28 DIAGNOSIS — E119 Type 2 diabetes mellitus without complications: Secondary | ICD-10-CM | POA: Diagnosis not present

## 2018-02-05 DIAGNOSIS — E559 Vitamin D deficiency, unspecified: Secondary | ICD-10-CM | POA: Diagnosis not present

## 2018-02-05 DIAGNOSIS — I10 Essential (primary) hypertension: Secondary | ICD-10-CM | POA: Diagnosis not present

## 2018-02-05 DIAGNOSIS — Z0000 Encounter for general adult medical examination without abnormal findings: Secondary | ICD-10-CM | POA: Diagnosis not present

## 2018-02-05 DIAGNOSIS — Z1389 Encounter for screening for other disorder: Secondary | ICD-10-CM | POA: Diagnosis not present

## 2018-02-05 DIAGNOSIS — E119 Type 2 diabetes mellitus without complications: Secondary | ICD-10-CM | POA: Diagnosis not present

## 2018-02-24 IMAGING — MR MR HIP*R* W/O CM
4 of 5 series · 19 of 40 positions shown · non-contrast
Comparison: Right hip MRI dated August 10, 2015.

CLINICAL DATA: Right hip pain.

EXAM:
MR OF THE RIGHT HIP WITHOUT CONTRAST
TECHNIQUE: Multiplanar, multisequence MR imaging was performed. No intravenous
contrast was administered.

[Series 4: T1 · coronal · 4.0mm · 0.59mm/px · 3 of 19 slices shown (1 of 2)]
[im 4/19]
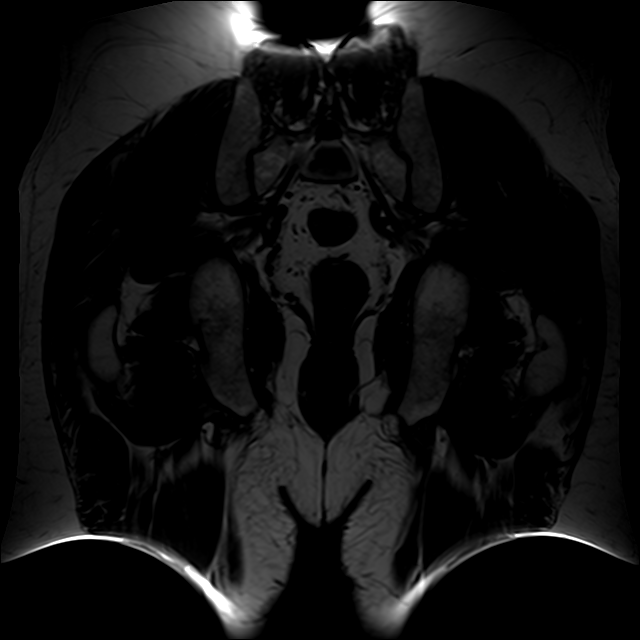
[im 10/19]
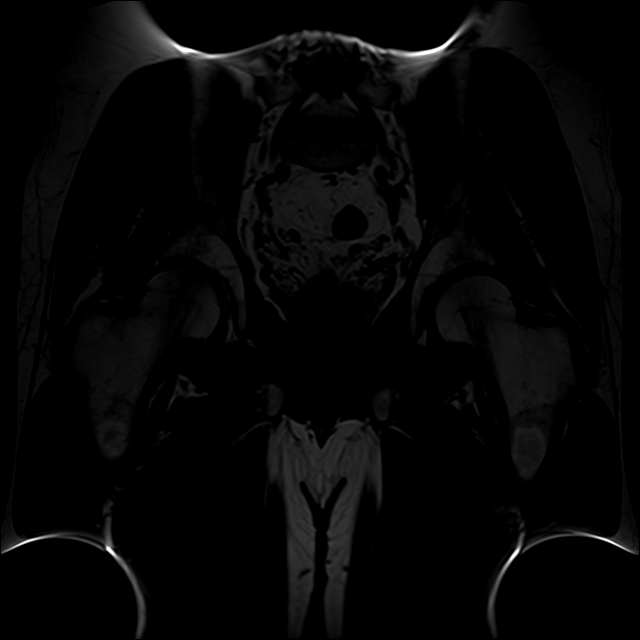
[im 16/19]
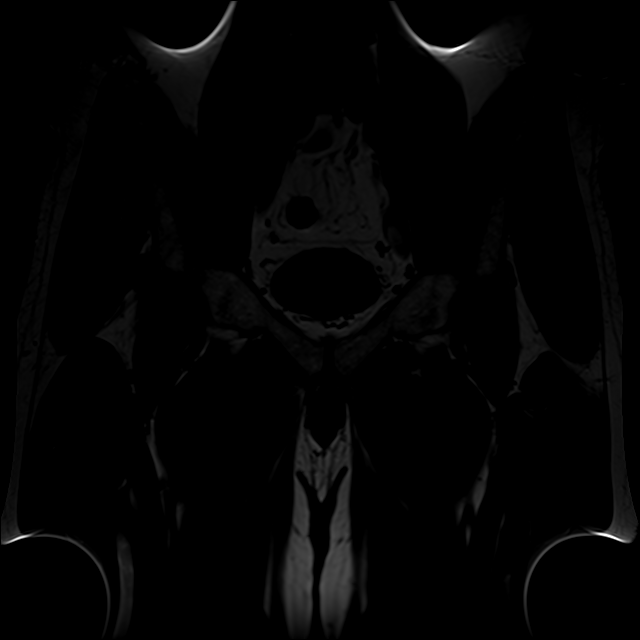

[Series 5: T2 fat-sat · axial · 4.0mm · 0.59mm/px · z∈[-9,+92]mm · 5 of 25 slices shown]
[im 1/25]
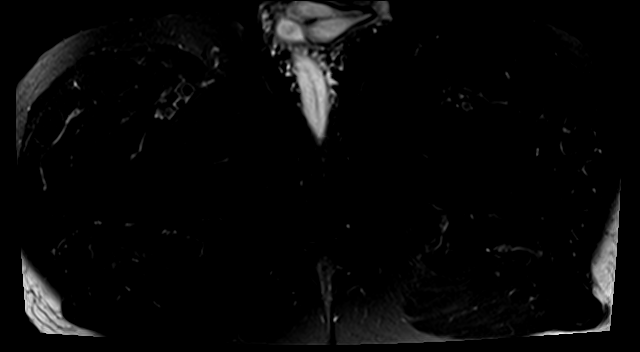
[im 4/25]
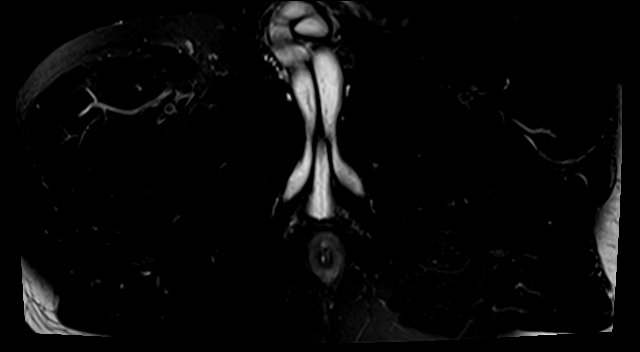
[im 7/25]
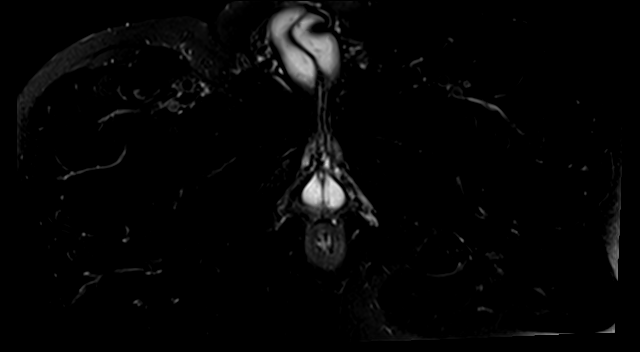
[im 13/25]
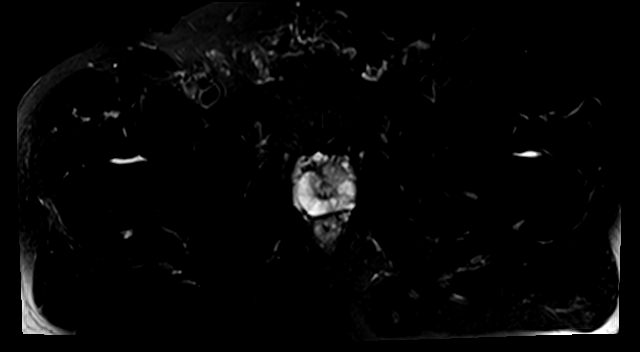
[im 22/25]
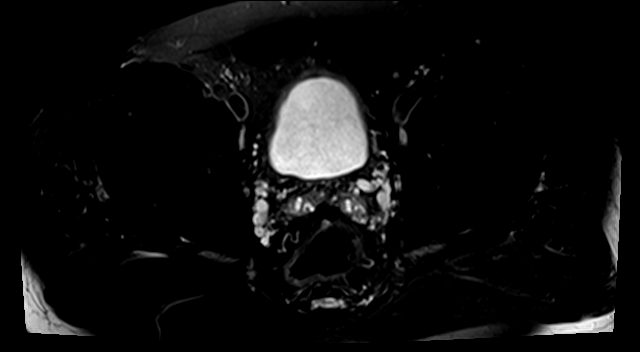

[Series 6: T1 · axial · 4.0mm · 0.59mm/px · z∈[+6,+92]mm · 3 of 25 slices shown (2 of 2)]
[im 4/25]
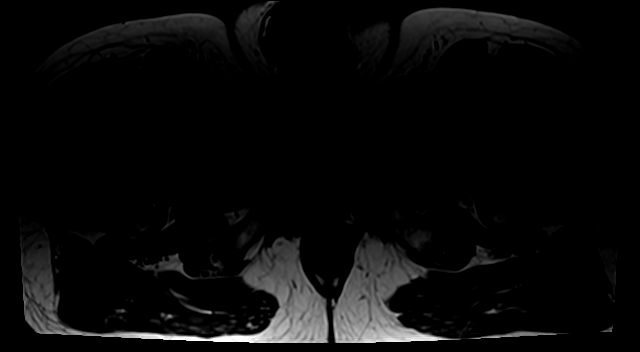
[im 13/25]
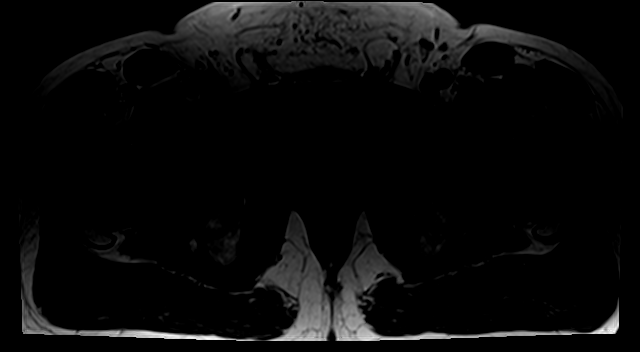
[im 22/25]
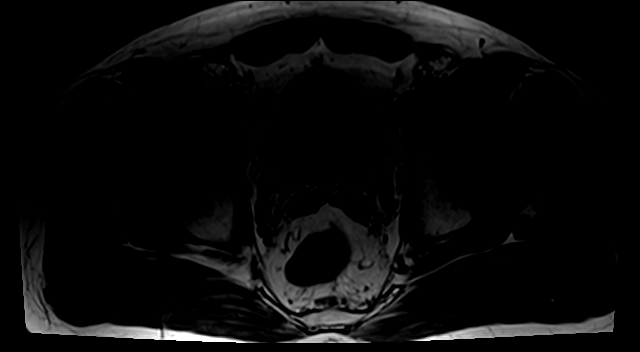

[Series 7: PD fat-sat · sagittal · 4.0mm · 0.33mm/px · 8 of 23 slices shown]
[im 1/23]
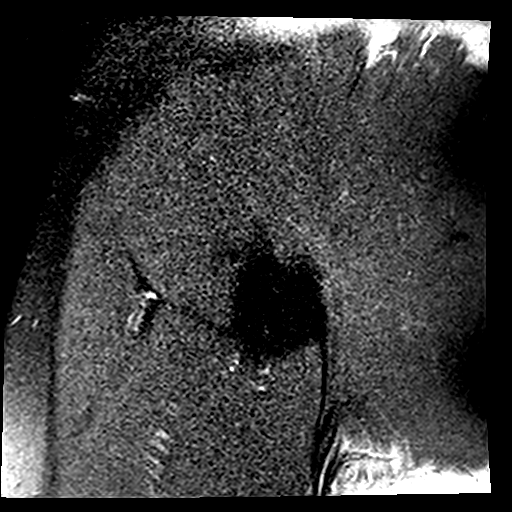
[im 4/23]
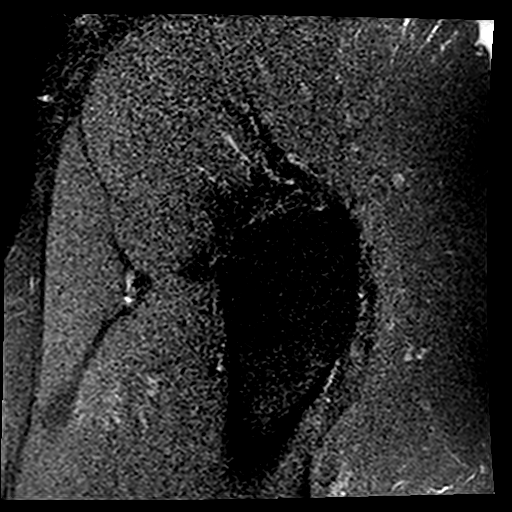
[im 7/23]
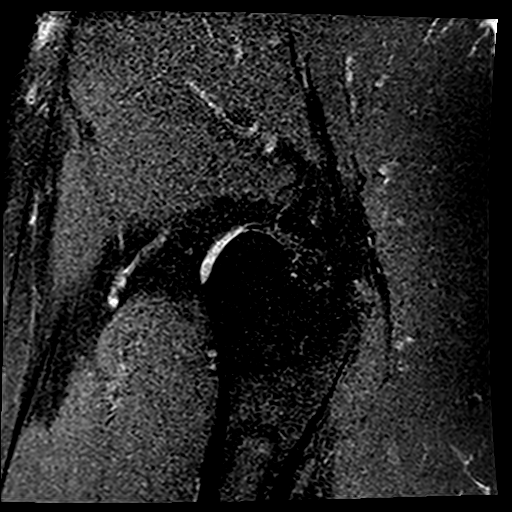
[im 10/23]
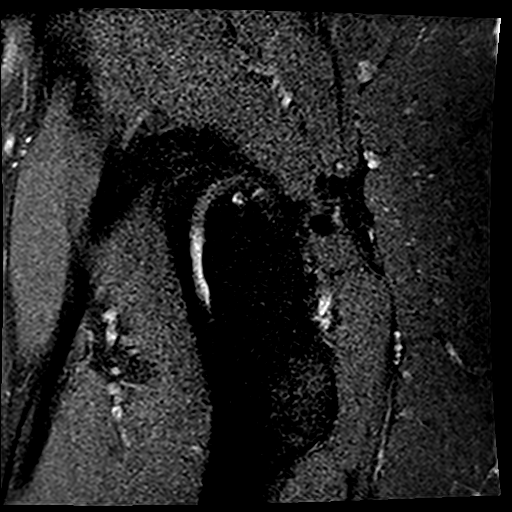
[im 13/23]
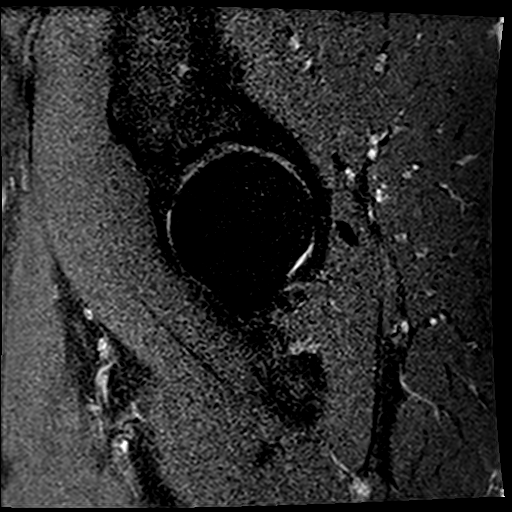
[im 16/23]
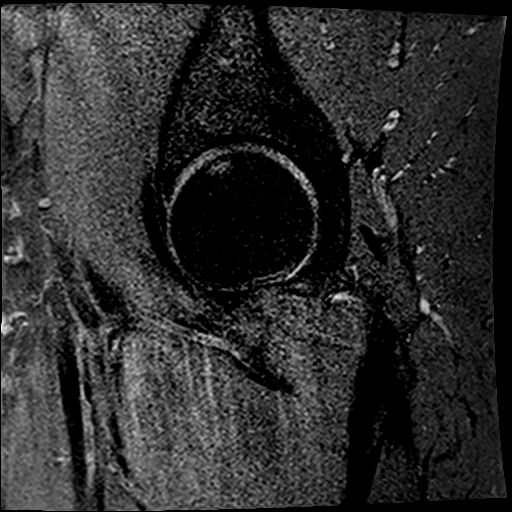
[im 19/23]
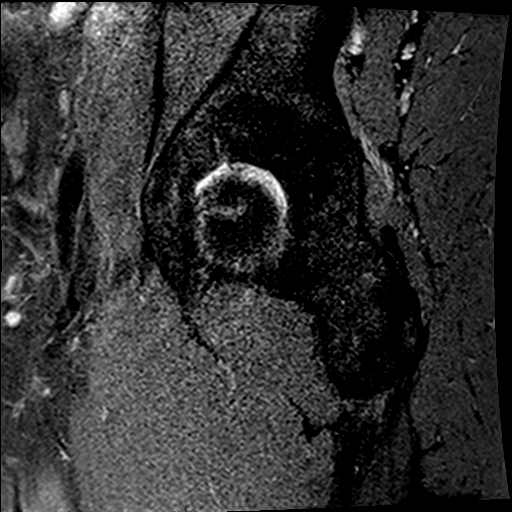
[im 23/23]
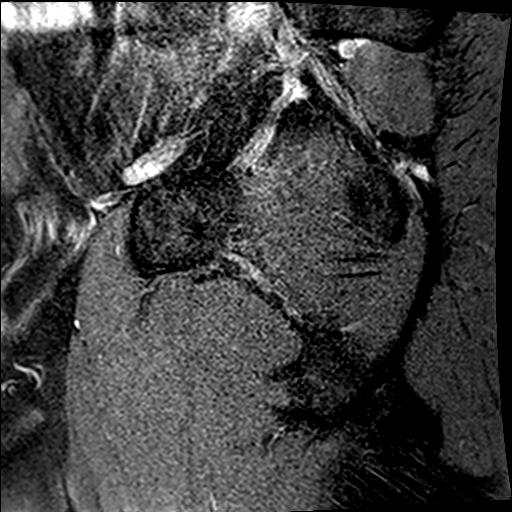

[19 of 40 positions shown; findings below may reference images not displayed]

FINDINGS: Bones: The previously seen marrow edema in the right femoral head
and neck has resolved. There is a focal area of cortical
irregularity and flattening in the superior right humeral head
measuring approximately 8 mm in transverse dimension, consistent
with avascular necrosis. No subchondral collapse. Remaining bone
marrow signal is normal. The visualized sacroiliac joints and
symphysis pubis appear normal.

Articular cartilage and labrum

Articular cartilage:  Mildly thinned without focal defect.

Labrum: There is no gross labral tear or paralabral abnormality.

Joint or bursal effusion

Joint effusion: No significant hip joint effusion.

Bursae: No focal periarticular fluid collection.

Muscles and tendons

Muscles and tendons: Mild left hamstring tendinosis. The visualized
gluteus, hamstring and iliopsoas tendons otherwise appear normal.
The piriformis muscles appear symmetric.

Other findings

Miscellaneous: The visualized internal pelvic contents appear
unremarkable.
IMPRESSION: 1. Focal 8 mm area of cortical irregularity and flattening of the
superior right humeral head, consistent with avascular necrosis. No
evidence of subchondral collapse.
2. Resolved transient osteoporosis of the right hip.

## 2018-09-18 ENCOUNTER — Other Ambulatory Visit (INDEPENDENT_AMBULATORY_CARE_PROVIDER_SITE_OTHER): Payer: Self-pay

## 2018-09-18 ENCOUNTER — Encounter (INDEPENDENT_AMBULATORY_CARE_PROVIDER_SITE_OTHER): Payer: Self-pay | Admitting: Orthopaedic Surgery

## 2018-09-18 ENCOUNTER — Ambulatory Visit (INDEPENDENT_AMBULATORY_CARE_PROVIDER_SITE_OTHER): Payer: BLUE CROSS/BLUE SHIELD

## 2018-09-18 ENCOUNTER — Ambulatory Visit (INDEPENDENT_AMBULATORY_CARE_PROVIDER_SITE_OTHER): Payer: BLUE CROSS/BLUE SHIELD | Admitting: Orthopaedic Surgery

## 2018-09-18 DIAGNOSIS — M25551 Pain in right hip: Secondary | ICD-10-CM | POA: Diagnosis not present

## 2018-09-18 NOTE — Progress Notes (Signed)
Office Visit Note   Patient: Samuel Stewart           Date of Birth: 12-Dec-1965           MRN: [id number] Visit Date: 09/18/2018              Requested by: Crist Infante, MD [address] [place], [place] [address] PCP: Crist Infante, MD   Assessment & Plan: Visit Diagnoses:  1. Pain in right hip     Plan: At this point a repeat MRI is absolutely warranted given his history of having osteoporosis so we can make sure that he is not developing impending femoral head collapse of the right hip.  All questions concerns were answered and addressed.  We will see him back once we obtain this MRI of his right hip.  Follow-Up Instructions: Return in about 2 years (around 09/18/2020).   Orders:  Orders Placed This Encounter  Procedures  . XR HIP UNILAT W OR W/O PELVIS 1V RIGHT   No orders of the defined types were placed in this encounter.     Procedures: No procedures performed   Clinical Data: No additional findings.   Subjective: Chief Complaint  Patient presents with  . Right Hip - Pain  The patient is incredibly well-known to me.  He is someone who has a history of transient osteoporosis became abruptly in late 2016 in early 2017 of his right hip.  An MRI showed these findings and it eventually resolved with time and activity modification.  In August 2018 he was having hip pain and we did obtain a new MRI of the right hip that showed resolution of the edema in the femoral head neck.  There was a small area of flattening of the femoral head consistent with avascular necrosis but intact cartilage.  The patient comes in today now a year and half later with right hip pain that is activity related.  He does feel a clicking in his hip and pain that only occurs with weightbearing and with activity ambulating.  It does not wake him up at night.  It does involve his right hip and groin area.  He has had no other active changes in medical status at all since we saw him last a year and a half  ago.  HPI  Review of Systems He currently denies any headache, chest pain, shortness of breath, fever, chills, nausea, vomiting.  Objective: Vital Signs: There were no vitals taken for this visit.  Physical Exam Is alert and orient x3 and in no acute distress Ortho Exam Examination of his right hip shows fluid range of motion that is full with no blocks to rotation.  There is slight pain in the groin with internal and rotation.  When he first stands up to take steps is when he experiences pain but he does walk that off. Specialty Comments:  No specialty comments available.  Imaging: Xr Hip Unilat W Or W/o Pelvis 1v Right  Result Date: 09/18/2018 2 views of the right hip are assessed and compared to previous x-rays.  The patient has had a history of transient osteoporosis.  An MRI that was repeated in August 2018 showed a small focus of AVN with flattening of the femoral head.  The joint space on today's films are well-maintained.  There is slight flattening corresponding with the MRI from 2018 is not changed.  There is otherwise no acute findings.  Previous particular spurring in the soft tissue is present and unchanged.  This does not appear to affect the joint capsule itself.    PMFS History: Patient Active Problem List   Diagnosis Date Noted  . Pain of right hip joint 04/11/2017  . Essential hypertension 09/17/2015   Past Medical History:  Diagnosis Date  . Collar bone fracture   . Compression fracture    2 thoracic, 1 lumbar  . Essential hypertension 09/17/2015  . Hyperlipidemia   . Kidney stones 2011  . Multiple rib fractures   . Seasonal allergies   . Wrist fracture    bilateral    Family History  Problem Relation Age of Onset  . Colon cancer Neg Hx   . Hypertension Mother   . Hypertension Father   . Kidney disease Maternal Grandmother   . Bladder Cancer Maternal Grandfather     Past Surgical History:  Procedure Laterality Date  . CLOSED REDUCTION ZYGOMATIC  ARCH FRACTURE    . olecranon process fracture    . osteochondritis desicant     Social History   Occupational History  . Not on file  Tobacco Use  . Smoking status: Never Smoker  . Smokeless tobacco: Never Used  Substance and Sexual Activity  . Alcohol use: Not on file    Comment: rare  . Drug use: No  . Sexual activity: Not on file

## 2018-09-24 ENCOUNTER — Ambulatory Visit
Admission: RE | Admit: 2018-09-24 | Discharge: 2018-09-24 | Disposition: A | Discharge status: Home / Self Care | Payer: BLUE CROSS/BLUE SHIELD | Source: Ambulatory Visit | Attending: Orthopaedic Surgery | Admitting: Orthopaedic Surgery

## 2018-09-24 DIAGNOSIS — M25551 Pain in right hip: Secondary | ICD-10-CM | POA: Diagnosis not present

## 2018-10-02 ENCOUNTER — Ambulatory Visit (INDEPENDENT_AMBULATORY_CARE_PROVIDER_SITE_OTHER): Payer: BLUE CROSS/BLUE SHIELD | Admitting: Orthopaedic Surgery

## 2018-10-02 ENCOUNTER — Encounter (INDEPENDENT_AMBULATORY_CARE_PROVIDER_SITE_OTHER): Payer: Self-pay | Admitting: Orthopaedic Surgery

## 2018-10-02 DIAGNOSIS — M25551 Pain in right hip: Secondary | ICD-10-CM

## 2018-10-02 NOTE — Progress Notes (Signed)
The patient is here today to go over an MRI of his right hip.  He has a remote history of transient osteoporosis of that hip.  He also had a small nidus of avascular necrosis.  His last MRI was in 2018.  He has been having some hip pain recently and 2 episodes where he felt like his hip gave out.  He says is still not incredibly painful but we needed to obtain another MRI of his hip to see where his avascular necrosis stands.  On exam he can move both hips fluidly.  He does have some pain of the trochanteric area on both sides which is minimal.  Is not a lot of pain is gone and no symptoms today of instability.  MRI is reviewed with him when I showed him the side-by-side pictures of the hip from 2018 and this most recent MRI.  He still has a small nidus of avascular necrosis but this is not changed at all.  It is in the right femoral head.  There is no evidence of transient osteoporosis.  I would say there is only mild arthritic changes.  At this point I would not recommend any type of surgery nor does he want surgery given the fact that is not having a lot of pain and this was only some episodes of the hip giving out.  All question concerns were answered and addressed.  At this point follow-up can be as needed.  If he does develop any worsening issues with the right hip he will let us know.

## 2019-03-17 DIAGNOSIS — Z0000 Encounter for general adult medical examination without abnormal findings: Secondary | ICD-10-CM | POA: Diagnosis not present

## 2019-03-17 DIAGNOSIS — E7849 Other hyperlipidemia: Secondary | ICD-10-CM | POA: Diagnosis not present

## 2019-03-17 DIAGNOSIS — E559 Vitamin D deficiency, unspecified: Secondary | ICD-10-CM | POA: Diagnosis not present

## 2019-03-17 DIAGNOSIS — E119 Type 2 diabetes mellitus without complications: Secondary | ICD-10-CM | POA: Diagnosis not present

## 2019-03-21 DIAGNOSIS — E119 Type 2 diabetes mellitus without complications: Secondary | ICD-10-CM | POA: Diagnosis not present

## 2019-03-21 DIAGNOSIS — R82998 Other abnormal findings in urine: Secondary | ICD-10-CM | POA: Diagnosis not present

## 2019-03-24 DIAGNOSIS — R3121 Asymptomatic microscopic hematuria: Secondary | ICD-10-CM | POA: Diagnosis not present

## 2019-03-24 DIAGNOSIS — E119 Type 2 diabetes mellitus without complications: Secondary | ICD-10-CM | POA: Diagnosis not present

## 2019-03-24 DIAGNOSIS — R945 Abnormal results of liver function studies: Secondary | ICD-10-CM | POA: Diagnosis not present

## 2019-03-24 DIAGNOSIS — Z1331 Encounter for screening for depression: Secondary | ICD-10-CM | POA: Diagnosis not present

## 2019-03-24 DIAGNOSIS — Z0000 Encounter for general adult medical examination without abnormal findings: Secondary | ICD-10-CM | POA: Diagnosis not present

## 2019-03-24 DIAGNOSIS — I10 Essential (primary) hypertension: Secondary | ICD-10-CM | POA: Diagnosis not present

## 2019-05-22 DIAGNOSIS — H524 Presbyopia: Secondary | ICD-10-CM | POA: Diagnosis not present

## 2019-05-22 DIAGNOSIS — H43813 Vitreous degeneration, bilateral: Secondary | ICD-10-CM | POA: Diagnosis not present

## 2019-05-22 DIAGNOSIS — H5310 Unspecified subjective visual disturbances: Secondary | ICD-10-CM | POA: Diagnosis not present

## 2019-08-11 DIAGNOSIS — L821 Other seborrheic keratosis: Secondary | ICD-10-CM | POA: Diagnosis not present

## 2019-08-11 DIAGNOSIS — D225 Melanocytic nevi of trunk: Secondary | ICD-10-CM | POA: Diagnosis not present

## 2019-08-11 DIAGNOSIS — A630 Anogenital (venereal) warts: Secondary | ICD-10-CM | POA: Diagnosis not present

## 2019-08-18 ENCOUNTER — Ambulatory Visit: Payer: BC Managed Care – PPO | Admitting: Orthopaedic Surgery

## 2019-08-18 ENCOUNTER — Ambulatory Visit: Payer: Self-pay

## 2019-08-18 ENCOUNTER — Other Ambulatory Visit: Payer: Self-pay

## 2019-08-18 DIAGNOSIS — M25512 Pain in left shoulder: Secondary | ICD-10-CM

## 2019-08-18 DIAGNOSIS — M25511 Pain in right shoulder: Secondary | ICD-10-CM | POA: Diagnosis not present

## 2019-08-18 DIAGNOSIS — M7532 Calcific tendinitis of left shoulder: Secondary | ICD-10-CM

## 2019-08-18 MED ORDER — LIDOCAINE HCL 1 % IJ SOLN
3.0000 mL | INTRAMUSCULAR | Status: AC | PRN
  Administered 2019-08-18: 3 mL

## 2019-08-18 MED ORDER — METHYLPREDNISOLONE ACETATE 40 MG/ML IJ SUSP
40.0000 mg | INTRAMUSCULAR | Status: AC | PRN
  Administered 2019-08-18: 40 mg via INTRA_ARTICULAR

## 2019-08-18 NOTE — Progress Notes (Signed)
Office Visit Note   Patient: Samuel Stewart           Date of Birth: 1966/06/11           MRN: [id number] Visit Date: 08/18/2019              Requested by: Crist Infante, MD [address] [place],  [place] [address] PCP: Crist Infante, MD   Assessment & Plan: Visit Diagnoses:  1. Acute pain of left shoulder   2. Acute pain of right shoulder   3. Calcific tendinitis of left shoulder     Plan: He does seem to have significant calcific tendinitis of the left shoulder based on his x-ray findings and clinical exam.  I did recommend a steroid injection in the subacromial outlet.  I explained the rationale behind this as well as the risk and benefits involved.  He did tolerate it well.  He will avoid overhead activities for now and try to not sleep with the shoulders above his head.  I would like to see him back in about 4 weeks for repeat exam.  No x-rays are needed.  My next step would be to obtain an MRI of his left shoulder if his pain persist.  He does not need physical therapy right now since his range of motion is full.  Follow-Up Instructions: Return in about 4 weeks (around 09/15/2019).   Orders:  Orders Placed This Encounter  Procedures  . Large Joint Inj  . XR Shoulder Left  . XR Shoulder Right   No orders of the defined types were placed in this encounter.     Procedures: Large Joint Inj: L subacromial bursa on 08/18/2019 11:03 AM Indications: pain and diagnostic evaluation Details: 22 G 1.5 in needle  Arthrogram: No  Medications: 3 mL lidocaine 1 %; 40 mg methylPREDNISolone acetate 40 MG/ML Outcome: tolerated well, no immediate complications Procedure, treatment alternatives, risks and benefits explained, specific risks discussed. Consent was given by the patient. Immediately prior to procedure a time out was called to verify the correct patient, procedure, equipment, support staff and site/side marked as required. Patient was prepped and draped in the usual sterile  fashion.       Clinical Data: No additional findings.   Subjective: Chief Complaint  Patient presents with  . Left Shoulder - Pain  . Right Shoulder - Pain  The patient comes in today with bilateral shoulder pain left worse than right as well as right hip pain.  Right hip is more of a chronic issue.  He says is not changed in the year since we MRI of his hip.  He had a nidus of AVN and some mild arthritic changes.  He says it really only hurts him when he is on his elliptical.  It does not hurt when he is on exercise bike.  His left shoulder is been hurting with overhead activities.  Both shoulders hurt when he sleeps at night but he tends to sleep with his arms more above his head at the shoulder level.  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  He is never injured his shoulders before.  Is not diabetic.  HPI  Review of Systems He currently denies any headache, chest pain, shortness of breath, fever, chills, nausea, vomiting  Objective: Vital Signs: There were no vitals taken for this visit.  Physical Exam He is alert and orient x3 and in no acute distress Ortho Exam Examination of his left hip and right hip  shows that the move smoothly and fluidly.  There is no real stiffness.  Examination of both shoulders does show positive signs and tenderness on the left shoulder with intact rotator cuff bilaterally in terms of strength and range of motion.  He does show signs of impingement of both shoulders with the left worse than the right. Specialty Comments:  No specialty comments available.  Imaging: XR Shoulder Left  Result Date: 08/18/2019 3 views of the left shoulder show evidence of calcific tendinitis.  The shoulder is otherwise well located with no acute findings.  XR Shoulder Right  Result Date: 08/18/2019 3 views of the right shoulder show no acute findings.  The shoulder is well located.    PMFS History: Patient Active Problem List   Diagnosis Date Noted  . Pain  of right hip joint 04/11/2017  . Essential hypertension 09/17/2015   Past Medical History:  Diagnosis Date  . Collar bone fracture   . Compression fracture    2 thoracic, 1 lumbar  . Essential hypertension 09/17/2015  . Hyperlipidemia   . Kidney stones 2011  . Multiple rib fractures   . Seasonal allergies   . Wrist fracture    bilateral    Family History  Problem Relation Age of Onset  . Colon cancer Neg Hx   . Hypertension Mother   . Hypertension Father   . Kidney disease Maternal Grandmother   . Bladder Cancer Maternal Grandfather     Past Surgical History:  Procedure Laterality Date  . CLOSED REDUCTION ZYGOMATIC ARCH FRACTURE    . olecranon process fracture    . osteochondritis desicant     Social History   Occupational History  . Not on file  Tobacco Use  . Smoking status: Never Smoker  . Smokeless tobacco: Never Used  Substance and Sexual Activity  . Alcohol use: Not on file    Comment: rare  . Drug use: No  . Sexual activity: Not on file

## 2019-09-08 DIAGNOSIS — M810 Age-related osteoporosis without current pathological fracture: Secondary | ICD-10-CM | POA: Diagnosis not present

## 2019-09-15 ENCOUNTER — Ambulatory Visit: Payer: BC Managed Care – PPO | Admitting: Orthopaedic Surgery

## 2019-09-15 ENCOUNTER — Other Ambulatory Visit: Payer: Self-pay

## 2019-09-15 ENCOUNTER — Encounter: Payer: Self-pay | Admitting: Orthopaedic Surgery

## 2019-09-15 DIAGNOSIS — M7532 Calcific tendinitis of left shoulder: Secondary | ICD-10-CM

## 2019-09-15 DIAGNOSIS — M25512 Pain in left shoulder: Secondary | ICD-10-CM | POA: Diagnosis not present

## 2019-09-15 NOTE — Progress Notes (Signed)
The patient comes today with continued left shoulder pain.  It is all for a few days after a subacromial steroid injection.  He does a lot of heavy lifting.  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  It still hurts with overhead activities and reaching behind him and there are some slight weakness.  His plain films showed calcification of the soft tissue suggesting a possible calcific tendinitis as well.  Exam there is still pain and weakness of his shoulder throughout its arc of motion on the left side.  Given his plain film findings and the failure conservative treatment for left shoulder a MRI is warranted to rule out a rotator cuff tear and assess if there is a true degree of calcific tendinitis so we can come up with a better treatment plan for him.  He agrees with this as well due to his continued pain with activities.  We will see him back after the MRI.

## 2019-09-29 ENCOUNTER — Ambulatory Visit: Payer: BC Managed Care – PPO | Admitting: Orthopaedic Surgery

## 2019-10-08 ENCOUNTER — Other Ambulatory Visit: Payer: BC Managed Care – PPO

## 2019-10-14 ENCOUNTER — Ambulatory Visit: Payer: BC Managed Care – PPO | Admitting: Orthopaedic Surgery

## 2019-11-04 ENCOUNTER — Other Ambulatory Visit: Payer: Self-pay

## 2019-11-04 ENCOUNTER — Ambulatory Visit
Admission: RE | Admit: 2019-11-04 | Discharge: 2019-11-04 | Disposition: A | Discharge status: Home / Self Care | Payer: BC Managed Care – PPO | Source: Ambulatory Visit | Attending: Orthopaedic Surgery | Admitting: Orthopaedic Surgery

## 2019-11-04 DIAGNOSIS — M7532 Calcific tendinitis of left shoulder: Secondary | ICD-10-CM

## 2019-11-04 DIAGNOSIS — M19012 Primary osteoarthritis, left shoulder: Secondary | ICD-10-CM | POA: Diagnosis not present

## 2019-11-10 ENCOUNTER — Ambulatory Visit: Payer: BC Managed Care – PPO | Admitting: Orthopaedic Surgery

## 2019-11-10 ENCOUNTER — Other Ambulatory Visit: Payer: Self-pay

## 2019-11-10 ENCOUNTER — Encounter: Payer: Self-pay | Admitting: Orthopaedic Surgery

## 2019-11-10 DIAGNOSIS — M7542 Impingement syndrome of left shoulder: Secondary | ICD-10-CM | POA: Diagnosis not present

## 2019-11-10 DIAGNOSIS — M25512 Pain in left shoulder: Secondary | ICD-10-CM

## 2019-11-10 NOTE — Progress Notes (Signed)
The patient comes in today to go over a MRI of his left shoulder.  He states the Voltaren gel has worked quite a bit for him.  He still has pain with overhead activities and it is an annoying type of pain.  He denies any weakness.  He denies any loss of function or motion with his left shoulder.  On examination he has full range of motion of his left shoulder.  He does have positive signs of impingement that are mild to moderate.  His rotator cuff is strong.  The MRI does show a type II acromion.  There is fluid in the subacromial subdeltoid bursa.  There is tendinosis of the rotator cuff but no tear.  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  I talked about the findings of his MRI and went over a shoulder model and described what this involves.  Since he is doing well enough I would consider continuing conservative treatment.  I would recommend another steroid injection at a later date if it becomes problematic for him.  All question concerns were answered addressed.  Follow-up can be as needed.

## 2020-01-26 DIAGNOSIS — D100 Benign neoplasm of lip: Secondary | ICD-10-CM | POA: Diagnosis not present

## 2020-01-26 DIAGNOSIS — K1120 Sialoadenitis, unspecified: Secondary | ICD-10-CM | POA: Diagnosis not present

## 2020-02-03 DIAGNOSIS — K1120 Sialoadenitis, unspecified: Secondary | ICD-10-CM | POA: Diagnosis not present

## 2020-02-03 DIAGNOSIS — D100 Benign neoplasm of lip: Secondary | ICD-10-CM | POA: Diagnosis not present

## 2020-03-04 DIAGNOSIS — Z20822 Contact with and (suspected) exposure to covid-19: Secondary | ICD-10-CM | POA: Diagnosis not present

## 2020-03-05 ENCOUNTER — Encounter: Payer: Self-pay | Admitting: Internal Medicine

## 2020-03-17 DIAGNOSIS — E119 Type 2 diabetes mellitus without complications: Secondary | ICD-10-CM | POA: Diagnosis not present

## 2020-03-17 DIAGNOSIS — Z125 Encounter for screening for malignant neoplasm of prostate: Secondary | ICD-10-CM | POA: Diagnosis not present

## 2020-03-17 DIAGNOSIS — E559 Vitamin D deficiency, unspecified: Secondary | ICD-10-CM | POA: Diagnosis not present

## 2020-03-17 DIAGNOSIS — E669 Obesity, unspecified: Secondary | ICD-10-CM | POA: Diagnosis not present

## 2020-03-17 DIAGNOSIS — Z0000 Encounter for general adult medical examination without abnormal findings: Secondary | ICD-10-CM | POA: Diagnosis not present

## 2020-03-17 DIAGNOSIS — E7849 Other hyperlipidemia: Secondary | ICD-10-CM | POA: Diagnosis not present

## 2020-03-17 DIAGNOSIS — R7301 Impaired fasting glucose: Secondary | ICD-10-CM | POA: Diagnosis not present

## 2020-03-24 ENCOUNTER — Other Ambulatory Visit: Payer: Self-pay | Admitting: Internal Medicine

## 2020-03-24 DIAGNOSIS — Z1212 Encounter for screening for malignant neoplasm of rectum: Secondary | ICD-10-CM | POA: Diagnosis not present

## 2020-03-24 DIAGNOSIS — R82998 Other abnormal findings in urine: Secondary | ICD-10-CM | POA: Diagnosis not present

## 2020-03-24 DIAGNOSIS — A630 Anogenital (venereal) warts: Secondary | ICD-10-CM | POA: Diagnosis not present

## 2020-03-24 DIAGNOSIS — E785 Hyperlipidemia, unspecified: Secondary | ICD-10-CM

## 2020-03-24 DIAGNOSIS — E1169 Type 2 diabetes mellitus with other specified complication: Secondary | ICD-10-CM | POA: Diagnosis not present

## 2020-03-24 DIAGNOSIS — R945 Abnormal results of liver function studies: Secondary | ICD-10-CM | POA: Diagnosis not present

## 2020-03-24 DIAGNOSIS — Z113 Encounter for screening for infections with a predominantly sexual mode of transmission: Secondary | ICD-10-CM | POA: Diagnosis not present

## 2020-03-24 DIAGNOSIS — Z0000 Encounter for general adult medical examination without abnormal findings: Secondary | ICD-10-CM | POA: Diagnosis not present

## 2020-03-24 DIAGNOSIS — I10 Essential (primary) hypertension: Secondary | ICD-10-CM | POA: Diagnosis not present

## 2020-03-31 DIAGNOSIS — B029 Zoster without complications: Secondary | ICD-10-CM | POA: Diagnosis not present

## 2020-04-06 ENCOUNTER — Ambulatory Visit (AMBULATORY_SURGERY_CENTER): Payer: Self-pay

## 2020-04-06 ENCOUNTER — Other Ambulatory Visit: Payer: Self-pay

## 2020-04-06 VITALS — Ht 67.0 in | Wt 207.8 lb

## 2020-04-06 DIAGNOSIS — Z86010 Personal history of colonic polyps: Secondary | ICD-10-CM

## 2020-04-06 MED ORDER — SUTAB 1479-225-188 MG PO TABS: 12.0000 | ORAL_TABLET | ORAL | 0 refills | Status: DC

## 2020-04-06 NOTE — Progress Notes (Signed)
No allergies to soy or egg Pt is not on blood thinners or diet pills Denies issues with sedation/intubation Denies atrial flutter/fib Denies constipation   Emmi instructions given to pt  Pt is aware of Covid safety and care partner requirements.  Pt is having a colonoscopy instructions prepared.  During assessment it was noted pt was on Metformin.

## 2020-04-07 ENCOUNTER — Encounter: Payer: Self-pay | Admitting: Internal Medicine

## 2020-04-13 ENCOUNTER — Ambulatory Visit
Admission: RE | Admit: 2020-04-13 | Discharge: 2020-04-13 | Disposition: A | Discharge status: Home / Self Care | Payer: No Typology Code available for payment source | Source: Ambulatory Visit | Attending: Internal Medicine | Admitting: Internal Medicine

## 2020-04-13 DIAGNOSIS — E785 Hyperlipidemia, unspecified: Secondary | ICD-10-CM

## 2020-04-26 ENCOUNTER — Other Ambulatory Visit: Payer: Self-pay

## 2020-04-26 ENCOUNTER — Encounter: Payer: Self-pay | Admitting: Internal Medicine

## 2020-04-26 ENCOUNTER — Ambulatory Visit (AMBULATORY_SURGERY_CENTER): Payer: BC Managed Care – PPO | Admitting: Internal Medicine

## 2020-04-26 VITALS — BP 115/76 | HR 75 | Temp 97.1°F | Resp 14 | Ht 67.0 in | Wt 207.0 lb

## 2020-04-26 DIAGNOSIS — K635 Polyp of colon: Secondary | ICD-10-CM | POA: Diagnosis not present

## 2020-04-26 DIAGNOSIS — D125 Benign neoplasm of sigmoid colon: Secondary | ICD-10-CM

## 2020-04-26 DIAGNOSIS — Z86010 Personal history of colonic polyps: Secondary | ICD-10-CM

## 2020-04-26 MED ORDER — SODIUM CHLORIDE 0.9 % IV SOLN: 500.0000 mL | Freq: Once | INTRAVENOUS | Status: DC

## 2020-04-26 NOTE — Op Note (Signed)
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 The patient's tolerance of                            previous anesthesia was also reviewed. The risks                            and benefits of the procedure and the sedation                            options and risks were discussed with the patient.                            All questions were answered, and informed consent                            was obtained. Prior Anticoagulants: The patient has                            taken no previous anticoagulant or antiplatelet                            agents. ASA Grade Assessment: II - A patient with                            mild systemic disease. After reviewing the risks  and benefits, the patient was deemed in                            satisfactory condition to undergo the procedure.                           After obtaining informed consent, the colonoscope                            was passed under direct vision. Throughout the                            procedure, the patient's blood pressure, pulse, and                            oxygen saturations were monitored continuously. The                             Colonoscope was introduced through the anus and                            advanced to the cecum, identified by appendiceal                            orifice and ileocecal valve. The colonoscopy was                            performed without difficulty. The patient tolerated                            the procedure well. The quality of the bowel                            preparation was excellent. The ileocecal valve,                            appendiceal orifice, and rectum were photographed. Scope In: 3:11:05 PM Scope Out: 3:24:11 PM Scope Withdrawal Time: 0 hours 11 minutes 43 seconds  Total Procedure Duration: 0 hours 13 minutes 6 seconds  Findings:                 The digital rectal exam was normal.                           Two sessile polyps were found in the distal sigmoid                            colon. The polyps were 3 to 4 mm in size. These                            polyps were removed with a cold snare. Resection                            and retrieval were complete.  Multiple small and large-mouthed diverticula were                            found in the sigmoid colon, descending colon and                            hepatic flexure.                           Internal hemorrhoids were found during                            retroflexion. The hemorrhoids were small.                           The exam was otherwise without abnormality. Complications:            No immediate complications. Estimated Blood Loss:     Estimated blood loss was minimal. Impression:               - Two 3 to 4 mm polyps in the distal sigmoid colon,                            removed with a cold snare. Resected and retrieved.                           - Diverticulosis in the sigmoid colon, in the                            descending colon and at the hepatic flexure.                           - Small internal hemorrhoids.                            - The examination was otherwise normal. Recommendation:           - Patient has a contact number available for                            emergencies. The signs and symptoms of potential                            delayed complications were discussed with the                            patient. Return to normal activities tomorrow.                            Written discharge instructions were provided to the                            patient.                           - Resume previous diet.                           -  Continue present medications.                           - Await pathology results.                           - Repeat colonoscopy is recommended. The                            colonoscopy date will be determined after pathology                            results from today's exam become available for                            review. Jerene Bears, MD 04/26/2020 3:27:03 PM This report has been signed electronically.

## 2020-04-26 NOTE — Progress Notes (Signed)
Called to room to assist during endoscopic procedure.  Patient ID and intended procedure confirmed with present staff. Received instructions for my participation in the procedure from the performing physician.  

## 2020-04-26 NOTE — Progress Notes (Signed)
Report to PACU, RN, vss, BBS= Clear.  

## 2020-04-26 NOTE — Patient Instructions (Signed)
Handouts on polyps, diverticulosis, and hemorrhoids given to you today  Await pathology results     YOU HAD AN ENDOSCOPIC PROCEDURE TODAY AT THE [facility]:   Refer to the procedure report that was given to you for any specific questions about what was found during the examination.  If the procedure report does not answer your questions, please call your gastroenterologist to clarify.  If you requested that your care partner not be given the details of your procedure findings, then the procedure report has been included in a sealed envelope for you to review at your convenience later.  YOU SHOULD EXPECT: Some feelings of bloating in the abdomen. Passage of more gas than usual.  Walking can help get rid of the air that was put into your GI tract during the procedure and reduce the bloating. If you had a lower endoscopy (such as a colonoscopy or flexible sigmoidoscopy) you may notice spotting of blood in your stool or on the toilet paper. If you underwent a bowel prep for your procedure, you may not have a normal bowel movement for a few days.  Please Note:  You might notice some irritation and congestion in your nose or some drainage.  This is from the oxygen used during your procedure.  There is no need for concern and it should clear up in a day or so.  SYMPTOMS TO REPORT IMMEDIATELY:   Following lower endoscopy (colonoscopy or flexible sigmoidoscopy):  Excessive amounts of blood in the stool  Significant tenderness or worsening of abdominal pains  Swelling of the abdomen that is new, acute  Fever of 100F or higher  For urgent or emergent issues, a gastroenterologist can be reached at any hour by calling [phone]. Do not use MyChart messaging for urgent concerns.    DIET:  We do recommend a small meal at first, but then you may proceed to your regular diet.  Drink plenty of fluids but you should avoid alcoholic beverages for 24 hours.  ACTIVITY:  You should plan to  take it easy for the rest of today and you should NOT DRIVE or use heavy machinery until tomorrow (because of the sedation medicines used during the test).    FOLLOW UP: Our staff will call the number listed on your records 48-72 hours following your procedure to check on you and address any questions or concerns that you may have regarding the information given to you following your procedure. If we do not reach you, we will leave a message.  We will attempt to reach you two times.  During this call, we will ask if you have developed any symptoms of COVID 19. If you develop any symptoms (ie: fever, flu-like symptoms, shortness of breath, cough etc.) before then, please call [phone].  If you test positive for Covid 19 in the 2 weeks post procedure, please call and report this information to us.    If any biopsies were taken you will be contacted by phone or by letter within the next 1-3 weeks.  Please call us at [phone] if you have not heard about the biopsies in 3 weeks.    SIGNATURES/CONFIDENTIALITY: You and/or your care partner have signed paperwork which will be entered into your electronic medical record.  These signatures attest to the fact that that the information above on your After Visit Summary has been reviewed and is understood.  Full responsibility of the confidentiality of this discharge information lies with you and/or your care-partner. 

## 2020-04-26 NOTE — Progress Notes (Signed)
Pt's states no medical or surgical changes since previsit or office visit. VS by AG 

## 2020-04-28 ENCOUNTER — Telehealth: Payer: Self-pay | Admitting: *Deleted

## 2020-04-28 NOTE — Telephone Encounter (Signed)
Attempted f/u phone call. No answer. Left message. °

## 2020-04-28 NOTE — Telephone Encounter (Signed)
°  Follow up Call-  Call back number 04/26/2020  Post procedure Call Back phone  # [phone]  Permission to leave phone message Yes  Some recent data might be hidden    LMOM to call back with any questions or concerns.  Also, call back if patient has developed fever, respiratory issues or been dx with COVID or had any family members or close contacts diagnosed since her procedure.'

## 2020-05-03 ENCOUNTER — Encounter: Payer: Self-pay | Admitting: Internal Medicine

## 2020-05-03 DIAGNOSIS — M25551 Pain in right hip: Secondary | ICD-10-CM | POA: Diagnosis not present

## 2020-05-03 DIAGNOSIS — M818 Other osteoporosis without current pathological fracture: Secondary | ICD-10-CM | POA: Diagnosis not present

## 2020-05-07 DIAGNOSIS — M818 Other osteoporosis without current pathological fracture: Secondary | ICD-10-CM | POA: Diagnosis not present

## 2020-05-07 DIAGNOSIS — M25551 Pain in right hip: Secondary | ICD-10-CM | POA: Diagnosis not present

## 2020-06-01 DIAGNOSIS — H2513 Age-related nuclear cataract, bilateral: Secondary | ICD-10-CM | POA: Diagnosis not present

## 2020-06-01 DIAGNOSIS — H40013 Open angle with borderline findings, low risk, bilateral: Secondary | ICD-10-CM | POA: Diagnosis not present

## 2020-06-01 DIAGNOSIS — H5203 Hypermetropia, bilateral: Secondary | ICD-10-CM | POA: Diagnosis not present

## 2020-06-01 DIAGNOSIS — H524 Presbyopia: Secondary | ICD-10-CM | POA: Diagnosis not present

## 2020-06-22 DIAGNOSIS — Z20822 Contact with and (suspected) exposure to covid-19: Secondary | ICD-10-CM | POA: Diagnosis not present

## 2021-04-21 DIAGNOSIS — M5441 Lumbago with sciatica, right side: Secondary | ICD-10-CM | POA: Diagnosis not present

## 2021-04-21 DIAGNOSIS — M4726 Other spondylosis with radiculopathy, lumbar region: Secondary | ICD-10-CM | POA: Diagnosis not present

## 2021-04-21 DIAGNOSIS — M5451 Vertebrogenic low back pain: Secondary | ICD-10-CM | POA: Diagnosis not present

## 2021-04-21 DIAGNOSIS — M9903 Segmental and somatic dysfunction of lumbar region: Secondary | ICD-10-CM | POA: Diagnosis not present

## 2021-04-25 DIAGNOSIS — E1169 Type 2 diabetes mellitus with other specified complication: Secondary | ICD-10-CM | POA: Diagnosis not present

## 2021-04-25 DIAGNOSIS — R82998 Other abnormal findings in urine: Secondary | ICD-10-CM | POA: Diagnosis not present

## 2021-04-25 DIAGNOSIS — Z113 Encounter for screening for infections with a predominantly sexual mode of transmission: Secondary | ICD-10-CM | POA: Diagnosis not present

## 2021-04-25 DIAGNOSIS — E559 Vitamin D deficiency, unspecified: Secondary | ICD-10-CM | POA: Diagnosis not present

## 2021-04-25 DIAGNOSIS — Z125 Encounter for screening for malignant neoplasm of prostate: Secondary | ICD-10-CM | POA: Diagnosis not present

## 2021-04-25 DIAGNOSIS — E785 Hyperlipidemia, unspecified: Secondary | ICD-10-CM | POA: Diagnosis not present

## 2021-04-28 DIAGNOSIS — Z1339 Encounter for screening examination for other mental health and behavioral disorders: Secondary | ICD-10-CM | POA: Diagnosis not present

## 2021-04-28 DIAGNOSIS — I10 Essential (primary) hypertension: Secondary | ICD-10-CM | POA: Diagnosis not present

## 2021-04-28 DIAGNOSIS — Z0000 Encounter for general adult medical examination without abnormal findings: Secondary | ICD-10-CM | POA: Diagnosis not present

## 2021-04-28 DIAGNOSIS — Z1331 Encounter for screening for depression: Secondary | ICD-10-CM | POA: Diagnosis not present

## 2021-06-06 DIAGNOSIS — H5203 Hypermetropia, bilateral: Secondary | ICD-10-CM | POA: Diagnosis not present

## 2021-06-06 DIAGNOSIS — H40053 Ocular hypertension, bilateral: Secondary | ICD-10-CM | POA: Diagnosis not present

## 2021-06-06 DIAGNOSIS — H1789 Other corneal scars and opacities: Secondary | ICD-10-CM | POA: Diagnosis not present

## 2021-10-11 DIAGNOSIS — M810 Age-related osteoporosis without current pathological fracture: Secondary | ICD-10-CM | POA: Diagnosis not present

## 2022-01-20 DIAGNOSIS — H109 Unspecified conjunctivitis: Secondary | ICD-10-CM | POA: Diagnosis not present

## 2022-01-20 DIAGNOSIS — L03213 Periorbital cellulitis: Secondary | ICD-10-CM | POA: Diagnosis not present

## 2022-03-01 DIAGNOSIS — L720 Epidermal cyst: Secondary | ICD-10-CM | POA: Diagnosis not present

## 2022-03-01 DIAGNOSIS — L918 Other hypertrophic disorders of the skin: Secondary | ICD-10-CM | POA: Diagnosis not present

## 2022-03-01 DIAGNOSIS — D225 Melanocytic nevi of trunk: Secondary | ICD-10-CM | POA: Diagnosis not present

## 2022-03-01 DIAGNOSIS — L820 Inflamed seborrheic keratosis: Secondary | ICD-10-CM | POA: Diagnosis not present

## 2022-04-11 DIAGNOSIS — E1169 Type 2 diabetes mellitus with other specified complication: Secondary | ICD-10-CM | POA: Diagnosis not present

## 2022-04-11 DIAGNOSIS — E785 Hyperlipidemia, unspecified: Secondary | ICD-10-CM | POA: Diagnosis not present

## 2022-04-11 DIAGNOSIS — E559 Vitamin D deficiency, unspecified: Secondary | ICD-10-CM | POA: Diagnosis not present

## 2022-04-11 DIAGNOSIS — Z125 Encounter for screening for malignant neoplasm of prostate: Secondary | ICD-10-CM | POA: Diagnosis not present

## 2022-04-11 DIAGNOSIS — Z0000 Encounter for general adult medical examination without abnormal findings: Secondary | ICD-10-CM | POA: Diagnosis not present

## 2022-04-11 DIAGNOSIS — I10 Essential (primary) hypertension: Secondary | ICD-10-CM | POA: Diagnosis not present

## 2022-04-14 DIAGNOSIS — E785 Hyperlipidemia, unspecified: Secondary | ICD-10-CM | POA: Diagnosis not present

## 2022-05-16 DIAGNOSIS — Z0000 Encounter for general adult medical examination without abnormal findings: Secondary | ICD-10-CM | POA: Diagnosis not present

## 2022-05-16 DIAGNOSIS — I2510 Atherosclerotic heart disease of native coronary artery without angina pectoris: Secondary | ICD-10-CM | POA: Diagnosis not present

## 2022-05-16 DIAGNOSIS — I10 Essential (primary) hypertension: Secondary | ICD-10-CM | POA: Diagnosis not present

## 2022-05-16 DIAGNOSIS — M810 Age-related osteoporosis without current pathological fracture: Secondary | ICD-10-CM | POA: Diagnosis not present

## 2022-05-16 DIAGNOSIS — Z1331 Encounter for screening for depression: Secondary | ICD-10-CM | POA: Diagnosis not present

## 2022-08-15 DIAGNOSIS — H1789 Other corneal scars and opacities: Secondary | ICD-10-CM | POA: Diagnosis not present

## 2022-08-15 DIAGNOSIS — H52203 Unspecified astigmatism, bilateral: Secondary | ICD-10-CM | POA: Diagnosis not present

## 2022-08-15 DIAGNOSIS — H5203 Hypermetropia, bilateral: Secondary | ICD-10-CM | POA: Diagnosis not present

## 2022-12-04 DIAGNOSIS — I10 Essential (primary) hypertension: Secondary | ICD-10-CM | POA: Diagnosis not present

## 2022-12-04 DIAGNOSIS — E1169 Type 2 diabetes mellitus with other specified complication: Secondary | ICD-10-CM | POA: Diagnosis not present

## 2022-12-04 DIAGNOSIS — I2510 Atherosclerotic heart disease of native coronary artery without angina pectoris: Secondary | ICD-10-CM | POA: Diagnosis not present

## 2022-12-04 DIAGNOSIS — E785 Hyperlipidemia, unspecified: Secondary | ICD-10-CM | POA: Diagnosis not present

## 2022-12-07 DIAGNOSIS — E119 Type 2 diabetes mellitus without complications: Secondary | ICD-10-CM | POA: Diagnosis not present

## 2022-12-19 DIAGNOSIS — E1169 Type 2 diabetes mellitus with other specified complication: Secondary | ICD-10-CM | POA: Diagnosis not present

## 2022-12-19 DIAGNOSIS — E785 Hyperlipidemia, unspecified: Secondary | ICD-10-CM | POA: Diagnosis not present

## 2022-12-19 DIAGNOSIS — R739 Hyperglycemia, unspecified: Secondary | ICD-10-CM | POA: Diagnosis not present

## 2022-12-19 DIAGNOSIS — I2510 Atherosclerotic heart disease of native coronary artery without angina pectoris: Secondary | ICD-10-CM | POA: Diagnosis not present

## 2022-12-26 DIAGNOSIS — E1169 Type 2 diabetes mellitus with other specified complication: Secondary | ICD-10-CM | POA: Diagnosis not present

## 2022-12-26 DIAGNOSIS — I10 Essential (primary) hypertension: Secondary | ICD-10-CM | POA: Diagnosis not present

## 2022-12-26 DIAGNOSIS — E669 Obesity, unspecified: Secondary | ICD-10-CM | POA: Diagnosis not present

## 2022-12-26 DIAGNOSIS — I2510 Atherosclerotic heart disease of native coronary artery without angina pectoris: Secondary | ICD-10-CM | POA: Diagnosis not present

## 2023-06-12 DIAGNOSIS — Z1389 Encounter for screening for other disorder: Secondary | ICD-10-CM | POA: Diagnosis not present

## 2023-06-12 DIAGNOSIS — E559 Vitamin D deficiency, unspecified: Secondary | ICD-10-CM | POA: Diagnosis not present

## 2023-06-12 DIAGNOSIS — Z125 Encounter for screening for malignant neoplasm of prostate: Secondary | ICD-10-CM | POA: Diagnosis not present

## 2023-06-12 DIAGNOSIS — E1169 Type 2 diabetes mellitus with other specified complication: Secondary | ICD-10-CM | POA: Diagnosis not present

## 2023-06-19 DIAGNOSIS — I10 Essential (primary) hypertension: Secondary | ICD-10-CM | POA: Diagnosis not present

## 2023-06-19 DIAGNOSIS — E1169 Type 2 diabetes mellitus with other specified complication: Secondary | ICD-10-CM | POA: Diagnosis not present

## 2023-06-19 DIAGNOSIS — R82998 Other abnormal findings in urine: Secondary | ICD-10-CM | POA: Diagnosis not present

## 2023-06-19 DIAGNOSIS — Z0000 Encounter for general adult medical examination without abnormal findings: Secondary | ICD-10-CM | POA: Diagnosis not present

## 2023-06-19 DIAGNOSIS — Z23 Encounter for immunization: Secondary | ICD-10-CM | POA: Diagnosis not present

## 2023-08-31 DIAGNOSIS — H6591 Unspecified nonsuppurative otitis media, right ear: Secondary | ICD-10-CM | POA: Diagnosis not present

## 2023-08-31 DIAGNOSIS — J069 Acute upper respiratory infection, unspecified: Secondary | ICD-10-CM | POA: Diagnosis not present

## 2024-04-11 DIAGNOSIS — M545 Low back pain, unspecified: Secondary | ICD-10-CM | POA: Diagnosis not present
